# Patient Record
Sex: Male | Born: 1965 | Race: White | Hispanic: No | Smoking: Current every day smoker
Health system: Southern US, Community
[De-identification: ages and names within clinical notes are randomized; demographics above are authoritative.]

## PROBLEM LIST (undated history)

## (undated) DIAGNOSIS — G8929 Other chronic pain: Secondary | ICD-10-CM

## (undated) DIAGNOSIS — M549 Dorsalgia, unspecified: Secondary | ICD-10-CM

## (undated) DIAGNOSIS — M539 Dorsopathy, unspecified: Secondary | ICD-10-CM

## (undated) DIAGNOSIS — G5603 Carpal tunnel syndrome, bilateral upper limbs: Secondary | ICD-10-CM

## (undated) DIAGNOSIS — M542 Cervicalgia: Secondary | ICD-10-CM

## (undated) DIAGNOSIS — G894 Chronic pain syndrome: Secondary | ICD-10-CM

## (undated) DIAGNOSIS — M5412 Radiculopathy, cervical region: Secondary | ICD-10-CM

## (undated) DIAGNOSIS — M1990 Unspecified osteoarthritis, unspecified site: Secondary | ICD-10-CM

## (undated) DIAGNOSIS — J329 Chronic sinusitis, unspecified: Secondary | ICD-10-CM

## (undated) HISTORY — PX: NASAL SINUS SURGERY: SHX719

## (undated) HISTORY — PX: NECK SURGERY: SHX720

## (undated) HISTORY — PX: HAND SURGERY: SHX662

---

## 1997-12-19 ENCOUNTER — Emergency Department (HOSPITAL_COMMUNITY): Admission: EM | Admit: 1997-12-19 | Discharge: 1997-12-19 | Payer: Self-pay | Admitting: Emergency Medicine

## 1998-02-07 ENCOUNTER — Ambulatory Visit (HOSPITAL_COMMUNITY): Admission: RE | Admit: 1998-02-07 | Discharge: 1998-02-07 | Payer: Self-pay | Admitting: Internal Medicine

## 1998-06-07 ENCOUNTER — Encounter: Payer: Self-pay | Admitting: Neurosurgery

## 1998-06-07 ENCOUNTER — Inpatient Hospital Stay (HOSPITAL_COMMUNITY): Admission: RE | Admit: 1998-06-07 | Discharge: 1998-06-08 | Payer: Self-pay | Admitting: Neurosurgery

## 2000-06-10 ENCOUNTER — Ambulatory Visit (HOSPITAL_BASED_OUTPATIENT_CLINIC_OR_DEPARTMENT_OTHER): Admission: RE | Admit: 2000-06-10 | Discharge: 2000-06-10 | Payer: Self-pay | Admitting: Otolaryngology

## 2000-09-15 ENCOUNTER — Emergency Department (HOSPITAL_COMMUNITY): Admission: EM | Admit: 2000-09-15 | Discharge: 2000-09-15 | Payer: Self-pay | Admitting: Internal Medicine

## 2000-09-15 ENCOUNTER — Encounter: Payer: Self-pay | Admitting: Internal Medicine

## 2000-09-24 ENCOUNTER — Ambulatory Visit (HOSPITAL_COMMUNITY): Admission: RE | Admit: 2000-09-24 | Discharge: 2000-09-24 | Payer: Self-pay | Admitting: Orthopedic Surgery

## 2000-09-24 ENCOUNTER — Encounter: Payer: Self-pay | Admitting: Orthopedic Surgery

## 2003-01-20 ENCOUNTER — Encounter (HOSPITAL_COMMUNITY): Admission: RE | Admit: 2003-01-20 | Discharge: 2003-02-19 | Payer: Self-pay | Admitting: Family Medicine

## 2003-02-01 ENCOUNTER — Encounter: Payer: Self-pay | Admitting: Family Medicine

## 2003-03-28 ENCOUNTER — Emergency Department (HOSPITAL_COMMUNITY): Admission: EM | Admit: 2003-03-28 | Discharge: 2003-03-28 | Payer: Self-pay | Admitting: Emergency Medicine

## 2003-04-13 ENCOUNTER — Encounter: Admission: RE | Admit: 2003-04-13 | Discharge: 2003-04-13 | Payer: Self-pay | Admitting: Allergy and Immunology

## 2007-04-25 ENCOUNTER — Emergency Department (HOSPITAL_COMMUNITY): Admission: EM | Admit: 2007-04-25 | Discharge: 2007-04-25 | Payer: Self-pay | Admitting: Emergency Medicine

## 2007-04-26 ENCOUNTER — Emergency Department (HOSPITAL_COMMUNITY): Admission: EM | Admit: 2007-04-26 | Discharge: 2007-04-26 | Payer: Self-pay | Admitting: Emergency Medicine

## 2007-04-29 ENCOUNTER — Ambulatory Visit (HOSPITAL_COMMUNITY): Admission: RE | Admit: 2007-04-29 | Discharge: 2007-04-29 | Payer: Self-pay | Admitting: Urology

## 2009-06-25 ENCOUNTER — Emergency Department (HOSPITAL_BASED_OUTPATIENT_CLINIC_OR_DEPARTMENT_OTHER): Admission: EM | Admit: 2009-06-25 | Discharge: 2009-06-25 | Payer: Self-pay | Admitting: Emergency Medicine

## 2009-07-20 ENCOUNTER — Encounter (HOSPITAL_COMMUNITY): Admission: RE | Admit: 2009-07-20 | Discharge: 2009-08-19 | Payer: Self-pay | Admitting: Family Medicine

## 2010-02-20 ENCOUNTER — Ambulatory Visit (HOSPITAL_COMMUNITY): Admission: RE | Admit: 2010-02-20 | Discharge: 2010-02-20 | Payer: Self-pay | Admitting: Family Medicine

## 2010-02-21 ENCOUNTER — Encounter (HOSPITAL_COMMUNITY)
Admission: RE | Admit: 2010-02-21 | Discharge: 2010-03-23 | Payer: Self-pay | Source: Home / Self Care | Admitting: Family Medicine

## 2010-06-22 ENCOUNTER — Ambulatory Visit (HOSPITAL_COMMUNITY)
Admission: RE | Admit: 2010-06-22 | Discharge: 2010-06-22 | Disposition: A | Discharge status: Home / Self Care | Payer: Managed Care, Other (non HMO) | Source: Ambulatory Visit | Attending: Family Medicine | Admitting: Family Medicine

## 2010-06-22 DIAGNOSIS — M6281 Muscle weakness (generalized): Secondary | ICD-10-CM | POA: Insufficient documentation

## 2010-06-22 DIAGNOSIS — IMO0001 Reserved for inherently not codable concepts without codable children: Secondary | ICD-10-CM | POA: Insufficient documentation

## 2010-06-22 DIAGNOSIS — M542 Cervicalgia: Secondary | ICD-10-CM | POA: Insufficient documentation

## 2010-06-27 ENCOUNTER — Ambulatory Visit (HOSPITAL_COMMUNITY)
Admission: RE | Admit: 2010-06-27 | Discharge: 2010-06-27 | Disposition: A | Discharge status: Home / Self Care | Payer: Managed Care, Other (non HMO) | Source: Ambulatory Visit | Attending: Family Medicine | Admitting: Family Medicine

## 2010-06-27 DIAGNOSIS — M6281 Muscle weakness (generalized): Secondary | ICD-10-CM | POA: Insufficient documentation

## 2010-06-27 DIAGNOSIS — M542 Cervicalgia: Secondary | ICD-10-CM | POA: Insufficient documentation

## 2010-06-27 DIAGNOSIS — IMO0001 Reserved for inherently not codable concepts without codable children: Secondary | ICD-10-CM | POA: Insufficient documentation

## 2010-06-28 ENCOUNTER — Ambulatory Visit (HOSPITAL_COMMUNITY)
Admission: RE | Admit: 2010-06-28 | Discharge: 2010-06-28 | Disposition: A | Discharge status: Home / Self Care | Payer: Managed Care, Other (non HMO) | Source: Ambulatory Visit | Attending: Family Medicine | Admitting: Family Medicine

## 2010-06-28 DIAGNOSIS — M542 Cervicalgia: Secondary | ICD-10-CM | POA: Insufficient documentation

## 2010-06-28 DIAGNOSIS — IMO0001 Reserved for inherently not codable concepts without codable children: Secondary | ICD-10-CM | POA: Insufficient documentation

## 2010-06-28 DIAGNOSIS — M6281 Muscle weakness (generalized): Secondary | ICD-10-CM | POA: Insufficient documentation

## 2010-06-30 ENCOUNTER — Ambulatory Visit (HOSPITAL_COMMUNITY)
Admission: RE | Admit: 2010-06-30 | Discharge: 2010-06-30 | Disposition: A | Discharge status: Home / Self Care | Payer: Managed Care, Other (non HMO) | Source: Ambulatory Visit | Attending: Family Medicine | Admitting: Family Medicine

## 2010-06-30 DIAGNOSIS — M6281 Muscle weakness (generalized): Secondary | ICD-10-CM | POA: Insufficient documentation

## 2010-06-30 DIAGNOSIS — IMO0001 Reserved for inherently not codable concepts without codable children: Secondary | ICD-10-CM | POA: Insufficient documentation

## 2010-06-30 DIAGNOSIS — M542 Cervicalgia: Secondary | ICD-10-CM | POA: Insufficient documentation

## 2010-07-05 ENCOUNTER — Ambulatory Visit (HOSPITAL_COMMUNITY): Payer: PRIVATE HEALTH INSURANCE | Admitting: Physical Therapy

## 2010-07-06 ENCOUNTER — Ambulatory Visit (HOSPITAL_COMMUNITY)
Admission: RE | Admit: 2010-07-06 | Discharge: 2010-07-06 | Disposition: A | Discharge status: Home / Self Care | Payer: Managed Care, Other (non HMO) | Source: Ambulatory Visit | Attending: Family Medicine | Admitting: Family Medicine

## 2010-07-07 ENCOUNTER — Ambulatory Visit (HOSPITAL_COMMUNITY)
Admission: RE | Admit: 2010-07-07 | Discharge: 2010-07-07 | Disposition: A | Discharge status: Home / Self Care | Payer: Managed Care, Other (non HMO) | Source: Ambulatory Visit | Attending: *Deleted | Admitting: *Deleted

## 2010-07-10 ENCOUNTER — Ambulatory Visit (HOSPITAL_COMMUNITY): Payer: PRIVATE HEALTH INSURANCE

## 2010-07-12 ENCOUNTER — Ambulatory Visit (HOSPITAL_COMMUNITY)
Admission: RE | Admit: 2010-07-12 | Discharge: 2010-07-12 | Disposition: A | Discharge status: Home / Self Care | Payer: Managed Care, Other (non HMO) | Source: Ambulatory Visit | Attending: *Deleted | Admitting: *Deleted

## 2010-07-14 ENCOUNTER — Ambulatory Visit (HOSPITAL_COMMUNITY)
Admission: RE | Admit: 2010-07-14 | Discharge: 2010-07-14 | Disposition: A | Discharge status: Home / Self Care | Payer: Managed Care, Other (non HMO) | Source: Ambulatory Visit | Attending: *Deleted | Admitting: *Deleted

## 2010-07-17 ENCOUNTER — Ambulatory Visit (HOSPITAL_COMMUNITY): Payer: PRIVATE HEALTH INSURANCE

## 2010-07-19 ENCOUNTER — Ambulatory Visit (HOSPITAL_COMMUNITY): Payer: PRIVATE HEALTH INSURANCE

## 2010-07-19 ENCOUNTER — Other Ambulatory Visit (HOSPITAL_COMMUNITY): Payer: Self-pay | Admitting: Family Medicine

## 2010-07-19 DIAGNOSIS — M549 Dorsalgia, unspecified: Secondary | ICD-10-CM

## 2010-07-21 ENCOUNTER — Ambulatory Visit (HOSPITAL_COMMUNITY): Admission: RE | Admit: 2010-07-21 | Payer: Managed Care, Other (non HMO) | Source: Ambulatory Visit

## 2010-07-31 ENCOUNTER — Ambulatory Visit (HOSPITAL_COMMUNITY)
Admission: RE | Admit: 2010-07-31 | Discharge: 2010-07-31 | Disposition: A | Discharge status: Home / Self Care | Payer: Managed Care, Other (non HMO) | Source: Ambulatory Visit | Attending: Family Medicine | Admitting: Family Medicine

## 2010-07-31 ENCOUNTER — Other Ambulatory Visit (HOSPITAL_COMMUNITY): Payer: Self-pay | Admitting: Family Medicine

## 2010-07-31 DIAGNOSIS — M549 Dorsalgia, unspecified: Secondary | ICD-10-CM

## 2010-07-31 DIAGNOSIS — M546 Pain in thoracic spine: Secondary | ICD-10-CM | POA: Insufficient documentation

## 2010-07-31 DIAGNOSIS — IMO0002 Reserved for concepts with insufficient information to code with codable children: Secondary | ICD-10-CM | POA: Insufficient documentation

## 2010-08-17 ENCOUNTER — Encounter (HOSPITAL_COMMUNITY): Payer: Managed Care, Other (non HMO) | Admitting: Physical Therapy

## 2010-10-06 NOTE — Op Note (Signed)
. [facility]  Patient:    Nathan Farmer, Nathan Farmer                        MRN: [id number] Proc. Date: 06/11/00 Adm. Date:  [id number] Attending:  Serena Colonel H                           Operative Report  PREOPERATIVE DIAGNOSIS:  Chronic ethmoid and maxillary sinusitis.  POSTOPERATIVE DIAGNOSIS:  Chronic ethmoid and maxillary sinusitis.  OPERATION PERFORMED: 1. Revision endoscopic total ethmoidectomy, bilateral. 2. Bilateral endoscopic maxillary antrostomy. 3. Use of Insta-Track image guidance system.  SURGEON:  Jefry H. Pollyann Kennedy, M.D.  ANESTHESIA:  General endotracheal.  COMPLICATIONS:  None.  ESTIMATED BLOOD LOSS:  50 cc.  The patient tolerated the procedure well, was awakened, extubated and transfered to recovery in stable condition.  INDICATIONS FOR PROCEDURE:  The patient is a 45 year old who underwent nasal surgery about a year ago and ever since that time has had chronic sinus infections, including congestion and nasal discharge.  He has had repeated CT scans of the sinuses which has consistently revealed ethmoid and maxillary sinus opacification.  The risks, benefits, alternatives and complications of the procedure were explained to the patient, who seemed to understand and agreed to surgery.  DESCRIPTION OF PROCEDURE:  The patient was taken to the operating room and placed on the operating table in supine position.  Following induction of general endotracheal anesthesia, the patient was prepped and draped in standard fashion.  The Insta-Track head gear was positioned properly and was calibrated.  1 - Bilateral endoscopic total ethmoidectomy.  0 degree endoscopes were used to visualize the nasal and sinus structures.  Topical Afrin was used for vasoconstriction preoperatively and 1% Xylocaine with epinephrine was infiltrated into the lateral nasal wall area and the ethmoid sinus area. Inspection with the 0 degree scope revealed complete  surgical absence of the middle turbinates bilaterally.  Multiple ethmoid cells were present, anterior and posterior and were obstructed and filled with polypoid material.  A complete ethmoidectomy was performed using Insta-Track guidance to be sure of positioning since the middle turbinates had been removed.  The lamina papyracea was intact all along the lateral wall.  The fovea ethmoidalis was intact superiorly.  These served as the limits of dissection.  The face of the sphenoid was identified bilaterally.  The left sphenoid was opened and some polypoid tissue was removed from the ostium.  The sinus itself was clear. Dissection up anteriorly revealed a large amount of polypoid tissue in the frontal recess area that was thoroughly cleaned out.  The frontal sinuses appeared to be clear.  2 - Bilateral endoscopic maxillary antrostomy.   The natural ostium of the maxillary sinuses bilaterally could not be identified.  The landmarks of the uncinate process and the middle turbinate were completely gone.  The lateral nasal wall was smooth and somewhat bulging towards the midline.  Using Insta-Track guidance, the maxillary sinuses were entered using curved suction and then the antrostomies were enlarged using backbiting forceps and straight through-cut forceps.  Large antrostomies were created bilaterally.  There was thick mucopurulent material in the right maxillary sinus.  The other noted finding was two superior septal perforations that were present.  The edges were clean and well healed.  The nasal cavities were suctioned of blood and debris and packed with ethmoid sinus packs in the bilateral ethmoid sinuses. The pharynx was  suctioned of blood and secretions.  The patient was then awakened, extubated and transferred to recovery. DD:  06/11/00 TD:  06/11/00 Job: [id number]

## 2011-02-26 LAB — STREP A DNA PROBE

## 2011-02-26 LAB — RAPID STREP SCREEN (MED CTR MEBANE ONLY): Streptococcus, Group A Screen (Direct): NEGATIVE

## 2011-11-15 ENCOUNTER — Encounter (HOSPITAL_BASED_OUTPATIENT_CLINIC_OR_DEPARTMENT_OTHER): Payer: Self-pay | Admitting: *Deleted

## 2011-11-15 ENCOUNTER — Emergency Department (HOSPITAL_BASED_OUTPATIENT_CLINIC_OR_DEPARTMENT_OTHER): Payer: Worker's Compensation

## 2011-11-15 ENCOUNTER — Emergency Department (HOSPITAL_BASED_OUTPATIENT_CLINIC_OR_DEPARTMENT_OTHER)
Admission: EM | Admit: 2011-11-15 | Discharge: 2011-11-15 | Disposition: A | Discharge status: Home / Self Care | Payer: Worker's Compensation | Attending: Emergency Medicine | Admitting: Emergency Medicine

## 2011-11-15 DIAGNOSIS — M542 Cervicalgia: Secondary | ICD-10-CM | POA: Insufficient documentation

## 2011-11-15 DIAGNOSIS — F17200 Nicotine dependence, unspecified, uncomplicated: Secondary | ICD-10-CM | POA: Insufficient documentation

## 2011-11-15 DIAGNOSIS — R209 Unspecified disturbances of skin sensation: Secondary | ICD-10-CM | POA: Insufficient documentation

## 2011-11-15 DIAGNOSIS — Z8739 Personal history of other diseases of the musculoskeletal system and connective tissue: Secondary | ICD-10-CM | POA: Insufficient documentation

## 2011-11-15 HISTORY — DX: Unspecified osteoarthritis, unspecified site: M19.90

## 2011-11-15 MED ORDER — CYCLOBENZAPRINE HCL 10 MG PO TABS: 5.0000 mg | ORAL_TABLET | Freq: Three times a day (TID) | ORAL | Status: AC | PRN

## 2011-11-15 NOTE — ED Notes (Signed)
pts supervisor david meeks called from [employer] to find out if pt is released to full duty spoke with dr hunt who treated him this morning she states he is to be off today but can return to full duty tomorrow Mr Nathan Farmer notified of the above
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
## 2011-11-15 NOTE — ED Notes (Signed)
Patient transported to CT 

## 2011-11-15 NOTE — ED Notes (Signed)
Pt states that his supervisor told him not to have a UDS done today during this visit.
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
## 2012-06-26 ENCOUNTER — Ambulatory Visit (HOSPITAL_COMMUNITY)
Admission: RE | Admit: 2012-06-26 | Discharge: 2012-06-26 | Disposition: A | Discharge status: Home / Self Care | Payer: Managed Care, Other (non HMO) | Source: Ambulatory Visit | Attending: Family Medicine | Admitting: Family Medicine

## 2012-06-26 DIAGNOSIS — IMO0001 Reserved for inherently not codable concepts without codable children: Secondary | ICD-10-CM | POA: Insufficient documentation

## 2012-06-26 DIAGNOSIS — M6281 Muscle weakness (generalized): Secondary | ICD-10-CM | POA: Insufficient documentation

## 2012-06-26 DIAGNOSIS — M62830 Muscle spasm of back: Secondary | ICD-10-CM | POA: Insufficient documentation

## 2012-06-26 DIAGNOSIS — M542 Cervicalgia: Secondary | ICD-10-CM | POA: Insufficient documentation

## 2012-06-26 NOTE — Evaluation (Signed)
Physical Therapy Evaluation  Patient Details  Name: Nathan Farmer MRN: [id number] Date of Birth: Jan 23, 1966 Charge: Eval; massage x 10 Today's Date: 06/26/2012 Time: 4098-1191 PT Time Calculation (min): 59 min  Visit#: 1  of 9   Re-eval: 07/17/12 Assessment Diagnosis: cervical pain Surgical Date:  (1999)  Authorization: generic commercial   Past Medical History:  Past Medical History  Diagnosis Date  . Arthritis    Past Surgical History:  Past Surgical History  Procedure Date  . Nasal sinus surgery   . Neck surgery   . Hand surgery     Subjective Symptoms/Limitations Symptoms: Nathan Farmer states that he has had increased neck and upper back pain; he states the pain is progressive.  He states his pain is more to the right and goes into his shoulder blade.  He states his pain is constant but varies in intensity. Pertinent History: cervical surgery 15 yrs ago How long can you sit comfortably?: He will have increased pain after 15 minutes if he is working on the computer.  If the patient is sitting in a chair not looking down he will have increased pain. How long can you stand comfortably?: The patient is able to stand; he shifts his weright back and forth How long can you walk comfortably?: walking does not bother him Pain Assessment Currently in Pain?: Yes Pain Score:  (worst is an 8-9/10) Pain Location: Neck Pain Orientation: Right Pain Radiating Towards: shoulder Pain Onset: More than a month ago Pain Frequency: Constant Pain Relieving Factors: pain meds    Prior Functioning  Prior Function Vocation: Full time employment Vocation Requirements: switcher-move trailers; looking backward all night Leisure: Hobbies-yes (Comment) Comments: golf but has not since October. ; rides [place]   Assessment Cervical AROM Cervical Flexion: wnl Cervical Extension: wnl Cervical - Right Side Bend: decreased 10% with reps increasing pain Cervical - Left Side Bend: decreased 20%  with reps causing increased pain Cervical - Right Rotation: decreased 10% Cervical - Left Rotation: decreasd 15% Cervical Strength Cervical Extension: 5/5 Cervical - Right Side Bend: 4/5 Cervical - Left Side Bend:  (4-/5)  Exercise/Treatments    Stretches Neck Stretch: 2 reps;30 seconds   Seated Exercises Cervical Isometrics: Extension;Right lateral flexion;Left lateral flexion;10 reps W Back: 5 reps Shoulder Shrugs:  (up/back and relax)  Manual Therapy Manual Therapy: Massage Massage: B cervical/upper thoracic w/ spasm noted at paraspinal mm T1-3  Physical Therapy Assessment and Plan PT Assessment and Plan Clinical Impression Statement: Pt with decreased stability and increased cervical pain who will benefit from skilled PT to decrease pt pain and improve his functinal activiy  without pain. Rehab Potential: Good PT Frequency: Min 3X/week PT Duration:  (3 weeks) PT Treatment/Interventions: Therapeutic exercise;Therapeutic activities;Manual techniques;Modalities PT Plan: begin postural t-band ex, shld adduction, UBE backward, c-retraction.  3rd treatment begin wall pushup, prone shld extension, prone rows; 4th treatment begin prone W-back, double arm raise; progress to plank    Goals Home Exercise Program Pt will Perform Home Exercise Program: Independently PT Short Term Goals Time to Complete Short Term Goals: 2 weeks PT Short Term Goal 1: ROM wnl to allow pt to look backwhen hooking up trailer at work without pain PT Short Term Goal 2: Pain no greater than a 4/10  PT Long Term Goals Time to Complete Long Term Goals: 4 weeks PT Long Term Goal 1: I in  advance HEP PT Long Term Goal 2: Pain no greater than a 2/10 80% of the day Long Term Goal  3: strength to be wnl to allow the above to occur  Problem List Patient Active Problem List  Diagnosis  . Spasm of paraspinal muscle  . Stiffness of joint, not elsewhere classified, other specified site    PT - End of  Session Activity Tolerance: Patient tolerated treatment well General Behavior During Session: [facility] for tasks performed Cognition: [facility] for tasks performed PT Plan of Care PT Home Exercise Plan: given  GP    Theresa Dohrman,CINDY 06/26/2012, 2:55 PM  Physician Documentation Your signature is required to indicate approval of the treatment plan as stated above.  Please sign and either send electronically or make a copy of this report for your files and return this physician signed original.   Please mark one 1.__approve of plan  2. ___approve of plan with the following conditions.   ______________________________                                                          _____________________ Physician Signature                                                                                                             Date

## 2012-06-30 ENCOUNTER — Ambulatory Visit (HOSPITAL_COMMUNITY)
Admission: RE | Admit: 2012-06-30 | Discharge: 2012-06-30 | Disposition: A | Discharge status: Home / Self Care | Payer: Managed Care, Other (non HMO) | Source: Ambulatory Visit | Attending: Family Medicine | Admitting: Family Medicine

## 2012-06-30 DIAGNOSIS — M62830 Muscle spasm of back: Secondary | ICD-10-CM

## 2012-06-30 NOTE — Progress Notes (Signed)
Physical Therapy Treatment Patient Details  Name: CORDARO MUKAI MRN: [id number] Date of Birth: Apr 04, 1966  Today's Date: 06/30/2012 Time: 0924-1015 PT Time Calculation (min): 51 min  Visit#: 2 of 9  Re-eval: 07/17/12 Assessment Diagnosis: cervical pain Charge: therex x 36', massage x 15'  Authorization: [employer] (consulting) Time Period:    Authorization Visit#: 2 of 9   Subjective: Symptoms/Limitations Symptoms: Pt stated neck pain 5-6/10 believes related to bad weather. Pain Assessment Pain Score:   6 Pain Location: Neck Pain Orientation: Right  Objective:   Exercise/Treatments Stretches Upper Trapezius Stretch: 2 reps;30 seconds Neck Stretch: 2 reps;30 seconds Machines for Strengthening UBE (Upper Arm Bike): 6' @ 1.0 backwards for postural strengthening Theraband Exercises Scapula Retraction: 15 reps;Blue Shoulder Extension: 15 reps;Blue Rows: 15 reps;Blue Shoulder ADduction: 15 reps;Blue Seated Exercises Neck Retraction: 10 reps;5 secs X to V: 10 reps W Back: 10 reps Shoulder Shrugs: 10 reps (up/back and relax)     Physical Therapy Assessment and Plan PT Assessment and Plan Clinical Impression Statement: Began PT treatment for postural strengthening per PT POC.  Pt able to demonstrate appropriate technique and form with theraband exercises following cues for correct musculature activation and core activation to improve posture.  Noted spasms R paraspinals T3-5, able to reduce but not fully resolve.  Pt encouraged to increase HEP frequency for maximum benefits. PT Plan: Next treatment begin wall pushups, prone shoulder extension and rows; 4th session begin prone w-back, double arm raises and progress to planks.    Goals    Problem List Patient Active Problem List  Diagnosis  . Spasm of paraspinal muscle  . Stiffness of joint, not elsewhere classified, other specified site    PT - End of Session Activity Tolerance: Patient  tolerated treatment well General Behavior During Session: [facility] for tasks performed Cognition: [facility] for tasks performed  GP    Juel Burrow 06/30/2012, 12:22 PM

## 2012-07-02 ENCOUNTER — Ambulatory Visit (HOSPITAL_COMMUNITY)
Admission: RE | Admit: 2012-07-02 | Discharge: 2012-07-02 | Disposition: A | Discharge status: Home / Self Care | Payer: Managed Care, Other (non HMO) | Source: Ambulatory Visit | Attending: Family Medicine | Admitting: Family Medicine

## 2012-07-02 DIAGNOSIS — M62830 Muscle spasm of back: Secondary | ICD-10-CM

## 2012-07-02 NOTE — Progress Notes (Signed)
Physical Therapy Treatment Patient Details  Name: Nathan Farmer MRN: [id number] Date of Birth: August 17, 1965  Today's Date: 07/02/2012 Time: 0930-1026 PT Time Calculation (min): 56 min  Visit#: 3 of    Re-eval: 07/17/12  Charge: therex 30' massage 23'  Authorization: [employer] (consulting) Time Period:    Authorization Visit#: 3 of 9   Subjective: Symptoms/Limitations Symptoms: My neck and spot on back are about average today, pain scale 5-6/10 due to the bad weather. Pain Assessment Currently in Pain?: Yes Pain Score:   6 Pain Location: Neck  Objective:   Exercise/Treatments Stretches Upper Trapezius Stretch: 2 reps;30 seconds Neck Stretch: 2 reps;30 seconds Machines for Strengthening UBE (Upper Arm Bike): 6' @ 1.0 backwards for postural strengthening Theraband Exercises Scapula Retraction: 15 reps;Blue Shoulder Extension: 15 reps;Blue Rows: 15 reps;Blue Shoulder ADduction: 15 reps;Blue Standing Exercises Wall Push Ups: 10 reps Seated Exercises Neck Retraction: 10 reps;5 secs X to V: 10 reps W Back: 10 reps Shoulder Shrugs: 10 reps (up/back and relax) Prone Exercises Shoulder Extension: 10 reps Rows: 10 reps  Manual Therapy Manual Therapy: Massage Massage: B cervical/thoracic w/ multiple spasms R side paraspinals T1-5 x 23'  Physical Therapy Assessment and Plan PT Assessment and Plan Clinical Impression Statement: Therex instructed by Nicholes Rough, PTT.  Added prone exercises and wall push up for postural strengthening, pt able to demonstrate appropraite technique following cues.  Several spasms noted R paraspinals T1-5 and scapular region, able to reduce but unable to fully resolve.  Pt stated pain reduced following manual.  Pt encouraged to increase frequency with HEP and stretches PT Plan: Continue current POC,  Next session begin prone wback, double arm raises and progress to planks.  Continue manual techniques to reduce spasms.     Goals    Problem List Patient Active Problem List  Diagnosis  . Spasm of paraspinal muscle  . Stiffness of joint, not elsewhere classified, other specified site    PT - End of Session Activity Tolerance: Patient tolerated treatment well General Behavior During Session: [facility] for tasks performed Cognition: [facility] for tasks performed  GP    Juel Burrow 07/02/2012, 1:21 PM

## 2012-07-04 ENCOUNTER — Ambulatory Visit (HOSPITAL_COMMUNITY): Payer: Self-pay

## 2012-07-07 ENCOUNTER — Ambulatory Visit (HOSPITAL_COMMUNITY)
Admission: RE | Admit: 2012-07-07 | Discharge: 2012-07-07 | Disposition: A | Discharge status: Home / Self Care | Payer: Managed Care, Other (non HMO) | Source: Ambulatory Visit | Attending: Family Medicine | Admitting: Family Medicine

## 2012-07-07 NOTE — Progress Notes (Signed)
Physical Therapy Treatment Patient Details  Name: Nathan Farmer MRN: [id number] Date of Birth: April 07, 1966  Today's Date: 07/07/2012 Time: 4098-1191 PT Time Calculation (min): 45 min  Visit#: 3 of 9  Re-eval: 07/17/12 Charges: Therex x 25' Manual x 15'  Authorization: [employer] (consulting) Visit#: 3 of 9   Subjective: Symptoms/Limitations Symptoms: Pt reprots "crunching" in his neck. Pain Assessment Currently in Pain?: Yes Pain Score:   7 Pain Location: Neck Pain Orientation: Right  Precautions/Restrictions     Exercise/Treatments Machines for Strengthening UBE (Upper Arm Bike): 6' @ 1.0 backwards for postural strengthening Theraband Exercises Scapula Retraction: 15 reps;Blue Shoulder Extension: 15 reps;Blue Rows: 15 reps;Blue Shoulder ADduction: 15 reps;Blue Seated Exercises Neck Retraction: 15 reps X to V: 15 reps W Back: 15 reps  Manual Therapy Manual Therapy: Massage Massage: B cervical/thoracic w/ multiple spasms R side paraspinals T1-5   Physical Therapy Assessment and Plan PT Assessment and Plan Clinical Impression Statement: Pt completes therex well with cuing for head alignment. Continued manual techniques to decrease pain and spasms. Spasms have decrease.  Pt reports pain decrease to 4/10 at end of session. Pt is interested in laser therapy. Will consult PT. PT Plan: Continue current POC,  Next session begin prone wback, double arm raises and progress to planks.  Continue manual techniques to reduce spasms.     Problem List Patient Active Problem List  Diagnosis  . Spasm of paraspinal muscle  . Stiffness of joint, not elsewhere classified, other specified site    PT - End of Session Activity Tolerance: Patient tolerated treatment well General Behavior During Session: [facility] for tasks performed Cognition: [facility] for tasks performed  Seth Bake, PTA  07/07/2012, 10:34 AM

## 2012-07-09 ENCOUNTER — Ambulatory Visit (HOSPITAL_COMMUNITY)
Admission: RE | Admit: 2012-07-09 | Discharge: 2012-07-09 | Disposition: A | Discharge status: Home / Self Care | Payer: Managed Care, Other (non HMO) | Source: Ambulatory Visit | Attending: Family Medicine | Admitting: Family Medicine

## 2012-07-09 NOTE — Progress Notes (Signed)
Physical Therapy Treatment Patient Details  Name: Nathan Farmer MRN: [id number] Date of Birth: [birth year]  Today's Date: 07/09/2012 Time: 1019-1108 PT Time Calculation (min): 49 min Visit#: 4 of 9  Re-eval: 07/17/12 Authorization: Generic Workers Engineer, civil (consulting) Visit#: 4 of 9  Charges;  Therex 28', massage 15'  Subjective: Symptoms/Limitations Symptoms: Pt states last treatment really helped.  Not having much pain today only 2-3/10. Pain Assessment Currently in Pain?: Yes Pain Score:   3 Pain Location: Neck Pain Orientation: Right   Exercise/Treatments Machines for Strengthening UBE (Upper Arm Bike): 6' @ 1.0 backwards for postural strengthening Theraband Exercises Scapula Retraction: 15 reps;Blue Shoulder Extension: 15 reps;Blue Rows: 15 reps;Blue Shoulder ADduction: 15 reps;Blue Other Theraband Exercises: tall kneel theraband B UE flexion Standing Exercises Wall Push Ups: 15 reps Seated Exercises Neck Retraction: 15 reps X to V: 15 reps W Back: 15 reps Shoulder Shrugs: 15 reps Prone Exercises W Back: Limitations W Back Limitations: add next visit Plank: add next visit   Manual Therapy Manual Therapy: Massage Massage: B cervical/thoracic with multiple spasms in seated position  Physical Therapy Assessment and Plan PT Assessment and Plan Clinical Impression Statement: Added kneeling B UE flexion exercise with tband.  Pt .required VC's to stabilize core with activites and keep head in neutral.  Overall reduction in spasms and less pain today before and after treatment..  Large spasm in R rhomboid unable to resolve. PT Plan: Continue current POC,  Next session begin prone wback, double arm raises and progress to planks.  Continue manual techniques to reduce spasms.     Problem List Patient Active Problem List  Diagnosis  . Spasm of paraspinal muscle  . Stiffness of joint, not elsewhere classified, other specified site    PT - End of  Session Activity Tolerance: Patient tolerated treatment well General Behavior During Session: [facility] for tasks performed Cognition: [facility] for tasks performed    Lurena Nida, PTA/CLT 07/09/2012, 11:19 AM

## 2012-07-11 ENCOUNTER — Ambulatory Visit (HOSPITAL_COMMUNITY)
Admission: RE | Admit: 2012-07-11 | Discharge: 2012-07-11 | Disposition: A | Discharge status: Home / Self Care | Payer: Managed Care, Other (non HMO) | Source: Ambulatory Visit | Attending: Family Medicine | Admitting: Family Medicine

## 2012-07-11 NOTE — Progress Notes (Signed)
Physical Therapy Treatment Patient Details  Name: Nathan Farmer MRN: [id number] Date of Birth: 12/27/1965  Today's Date: 07/11/2012 Time: 1016-1055 PT Time Calculation (min): 39 min Charge:  There ex 32'; massage x 14. Visit#: 5 of 9  Re-eval: 07/18/12    Authorization: Generic Workers Engineer, civil (consulting) Time Period:    Authorization Visit#: 5 of 9   Subjective: Symptoms/Limitations Symptoms: Pt states last new exercise really incrreased his back pain.   Pertinent History: cervical surgery 15 yrs ago Pain Assessment Currently in Pain?: Yes Pain Score:   5 Pain Location: Back Pain Orientation: Upper   Exercise/Treatments UBE (Upper Arm Bike): 4' @ 2.0 backwards for postural strengthening Theraband Exercises Scapula Retraction: 15 reps;Blue Shoulder Extension: 15 reps;Blue Rows: 15 reps;Blue Other Theraband Exercises: tall kneel theraband B UE flexion   Prone Exercises W Back: 10 reps;Weight Shoulder Extension: 10 reps;Weights Shoulder Extension Weights (lbs): 2 Rows: 10 reps;Weights Rows Weights (lbs): 2 Plank: 10" x 5    Manual Therapy Manual Therapy: Massage Massage: no spasms noted today.  Pt massage completed prone.  Physical Therapy Assessment and Plan PT Assessment and Plan Clinical Impression Statement: Pt given T-band for HEP; Pt added new exercises with good form after verbal cuing.  Planks were dificult for pt to complete. Rehab Potential: Good PT Frequency: Min 3X/week PT Treatment/Interventions: Therapeutic exercise;Therapeutic activities;Manual techniques;Modalities PT Plan: Continue with treatment begin gentle manual traction w/ suboccipital release next treatment.  Do not do mechanical tx as pt has had cervical surgery.    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 Spasm of paraspinal muscle  . Stiffness of joint, not elsewhere classified, other specified site    PT - End of Session Activity Tolerance: Patient tolerated treatment well General Behavior During Session: [facility] for tasks performed Cognition: [facility] for tasks performed  GP    RUSSELL,CINDY 07/11/2012, 12:07 PM

## 2012-07-14 ENCOUNTER — Ambulatory Visit (HOSPITAL_COMMUNITY)
Admission: RE | Admit: 2012-07-14 | Discharge: 2012-07-14 | Disposition: A | Discharge status: Home / Self Care | Payer: Managed Care, Other (non HMO) | Source: Ambulatory Visit | Attending: Family Medicine | Admitting: Family Medicine

## 2012-07-14 NOTE — Progress Notes (Signed)
Physical Therapy Treatment Patient Details  Name: DONDRELL LOUDERMILK MRN: [id number] Date of Birth: [birth year]  Today's Date: 07/14/2012 Time: 1020-1100 PT Time Calculation (min): 40 min  Visit#: 6 of 9  Re-eval: 07/18/12 Charges: Therex x 26' Manual x 12'  Authorization: Generic Workers Pharmacist, community Visit#: 6 of 9   Subjective: Symptoms/Limitations Symptoms: Pt states that pain has moved to L shoulder blade. Pain Assessment Currently in Pain?: Yes Pain Score:   4   Exercise/Treatments Prone Exercises W Back: 10 reps;Weight W Back Weights (lbs): 2 Shoulder Extension: 15 reps Shoulder Extension Weights (lbs): 2 Rows: 15 reps Rows Weights (lbs): 2 Plank: 10" x 5  Manual Therapy Manual Therapy: Massage Massage: MFR to L rhomboids to decrease spasms  Physical Therapy Assessment and Plan PT Assessment and Plan Clinical Impression Statement: Pt completes therex well with minimal need for cuing. Multiple spasms noted in L rhomboids. Spasms resolved 90% after manual techniques. P reports no change in pain at end of session. PT Plan: Continue to progress scapular strength and decrease spasms per PT POC.    Goals    Problem List Patient Active Problem List  Diagnosis  . Spasm of paraspinal muscle  . Stiffness of joint, not elsewhere classified, other specified site    PT - End of Session Activity Tolerance: Patient tolerated treatment well General Behavior During Session: [facility] for tasks performed Cognition: [facility] for tasks performed  Seth Bake, PTA  07/14/2012, 12:28 PM

## 2012-07-16 ENCOUNTER — Ambulatory Visit (HOSPITAL_COMMUNITY): Payer: Self-pay | Admitting: Physical Therapy

## 2012-07-18 ENCOUNTER — Ambulatory Visit (HOSPITAL_COMMUNITY)
Admission: RE | Admit: 2012-07-18 | Discharge: 2012-07-18 | Disposition: A | Discharge status: Home / Self Care | Payer: Managed Care, Other (non HMO) | Source: Ambulatory Visit | Attending: Family Medicine | Admitting: Family Medicine

## 2012-07-18 DIAGNOSIS — M62830 Muscle spasm of back: Secondary | ICD-10-CM

## 2012-07-18 NOTE — Evaluation (Signed)
Physical Therapy Evaluation  Patient Details  Name: Nathan Farmer MRN: [id number] Date of Birth: Jan 27, 1966  Today's Date: 07/18/2012 Time:1025  - 1056  charge:  ROM test,; there ex x 12; massage x 14  Visit#: 7 of 9  Re-eval: 07/18/12 Assessment Diagnosis: cervical pain Surgical Date:  (1999)  Past Medical History:  Past Medical History  Diagnosis Date  . Arthritis    Past Surgical History:  Past Surgical History  Procedure Laterality Date  . Nasal sinus surgery    . Neck surgery    . Hand surgery      Subjective Symptoms/Limitations Symptoms: Pt states that he does not feel much better. Pertinent History: cervical surgery 15 yrs ago How long can you sit comfortably?: Able to sit for 45 min befoe he begins hurting was 15.  How long can you stand comfortably?: The patient is able to stand; he shifts his weright back and forth How long can you walk comfortably?: walking does not bother him Pain Assessment Currently in Pain?: Yes Pain Score:   7 Pain Location: Neck Pain Type: Chronic pain Pain Onset: More than a month ago Pain Frequency: Constant    Prior Functioning  Prior Function Vocation: Full time employment Vocation Requirements: switcher-move trailers; looking backward all night Leisure: Hobbies-yes (Comment)   Assessment Cervical AROM Cervical Flexion: wnl Cervical Extension: wnl Cervical - Right Side Bend: wnl was decreased 10/5 (wnl) Cervical - Left Side Bend: wnl was decreased 20% Cervical - Right Rotation: decreased 10% Cervical - Left Rotation: decreasd 10% Cervical Strength Cervical Extension: 5/5 Cervical - Right Side Bend: 5/5 (was 4/5) Cervical - Left Side Bend: 5/5 (was 4-/5)  Exercise/Treatments   Standing Exercises Thumb Tacks:  (2') Other Standing Exercises: scapular elevation/ retraction x 10   Prone Exercises W Back: 10 reps;Weight W Back Weights (lbs): 3 Shoulder Extension: 15 reps Shoulder Extension Weights (lbs):  3 Rows: 15 reps Rows Weights (lbs): 3 Plank: 10" x 5  Manual Therapy Manual Therapy: Massage Massage: no spasm noted  Physical Therapy Assessment and Plan PT Assessment and Plan Clinical Impression Statement: Pt with improved objective but no change subjectively.  Pt ROM and strength is wfl but continues to complain of significant pain along R lower neck and R scapular area. PT Plan: discontinue therapy.  PT to return to MD as therapy does not appear to be decreasing subjective complaint of painl    Goals Home Exercise Program Pt will Perform Home Exercise Program: Independently PT Short Term Goals Time to Complete Short Term Goals: 2 weeks PT Short Term Goal 1: ROM wnl to allow pt to look backwhen hooking up trailer at work without pain PT Short Term Goal 1 - Progress: Partly met (ROM wnl but has pain doing motion) PT Short Term Goal 2: Pain no greater than a 4/10  PT Short Term Goal 2 - Progress: Not met PT Long Term Goals PT Long Term Goal 1: I in  advance HEP PT Long Term Goal 1 - Progress: Met PT Long Term Goal 2: Pain no greater than a 2/10 80% of the day PT Long Term Goal 2 - Progress: Not met Long Term Goal 3: strength to be wnl to allow the above to occur Long Term Goal 3 Progress: Met  Problem List Patient Active Problem List  Diagnosis  . Spasm of paraspinal muscle  . Stiffness of joint, not elsewhere classified, other specified site    PT - End of Session Activity Tolerance: Patient tolerated treatment  well General Behavior During Session: [facility] for tasks performed Cognition: [facility] for tasks performed PT Plan of Care PT Home Exercise Plan: given  GP    RUSSELL,CINDY 07/18/2012, 11:16 AM  Physician Documentation Your signature is required to indicate approval of the treatment plan as stated above.  Please sign and either send electronically or make a copy of this report for your files and return this physician signed original.   Please mark one 1.__approve  of plan  2. ___approve of plan with the following conditions.   ______________________________                                                          _____________________ Physician Signature                                                                                                             Date

## 2012-08-18 ENCOUNTER — Telehealth: Payer: Self-pay | Admitting: Family Medicine

## 2012-08-18 NOTE — Telephone Encounter (Signed)
Patients rash in his mouth is no better, and patient is requesting to have the "magic mouthwash" called in to [facility].

## 2012-08-18 NOTE — Telephone Encounter (Signed)
16 oz duke's mmw. One tablespoon swish and spit qid

## 2012-08-18 NOTE — Telephone Encounter (Signed)
Pt notified rx called in.

## 2012-08-18 NOTE — Telephone Encounter (Signed)
rx called in to pharm. [facility] to notify pt.

## 2012-09-08 ENCOUNTER — Telehealth: Payer: Self-pay | Admitting: Family Medicine

## 2012-09-08 NOTE — Telephone Encounter (Signed)
Pt needs a hand written 90 day supply due to his insurance policy for his Pantoprazole 40 mg, FYI.Marland Kitchen this patient has been FLAGGED for DISCHARGE.

## 2012-09-09 NOTE — Telephone Encounter (Signed)
rx ready for pick up pt notified.  

## 2012-09-10 ENCOUNTER — Ambulatory Visit (INDEPENDENT_AMBULATORY_CARE_PROVIDER_SITE_OTHER): Payer: Managed Care, Other (non HMO) | Admitting: Family Medicine

## 2012-09-10 ENCOUNTER — Encounter: Payer: Self-pay | Admitting: Family Medicine

## 2012-09-10 VITALS — BP 132/80 | Temp 98.4°F | Wt 227.4 lb

## 2012-09-10 DIAGNOSIS — J309 Allergic rhinitis, unspecified: Secondary | ICD-10-CM

## 2012-09-10 MED ORDER — METHYLPREDNISOLONE ACETATE 80 MG/ML IJ SUSP
80.0000 mg | Freq: Once | INTRAMUSCULAR | Status: AC
  Administered 2012-09-10: 80 mg via INTRAMUSCULAR

## 2012-09-10 NOTE — Progress Notes (Signed)
  Subjective:    Patient ID: Nathan Farmer, male    DOB: 1966/03/09, 47 y.o.   MRN: [id number]  Sinus Problem This is a new problem. The current episode started today. The problem is unchanged. There has been no fever.   Patient notes persistent congestion. Slight sore throat at times worse in the morning. Clearish nasal discharge. Significant itching of the eyes. Some diminished energy. Positive significant environmental exposures.   Review of Systems ROS otherwise negative    Objective:   Physical Exam  Alert mild malaise. Nasal discharge present eyes injected. Throat some drainage. Neck supple. Lungs clear. Heart regular in rhythm.      Assessment & Plan:  Impression allergic rhinitis unresponsive to current measures. Plan patient declines steroid nasal spray. We'll give steroid injection. Maintain loratadine. Avoidance measures discussed. WSL

## 2012-12-01 ENCOUNTER — Encounter (HOSPITAL_COMMUNITY): Payer: Self-pay | Admitting: *Deleted

## 2012-12-01 ENCOUNTER — Emergency Department (HOSPITAL_COMMUNITY): Payer: Managed Care, Other (non HMO)

## 2012-12-01 ENCOUNTER — Emergency Department (HOSPITAL_COMMUNITY)
Admission: EM | Admit: 2012-12-01 | Discharge: 2012-12-01 | Disposition: A | Discharge status: Home / Self Care | Payer: Managed Care, Other (non HMO) | Attending: Emergency Medicine | Admitting: Emergency Medicine

## 2012-12-01 DIAGNOSIS — S335XXA Sprain of ligaments of lumbar spine, initial encounter: Secondary | ICD-10-CM | POA: Insufficient documentation

## 2012-12-01 DIAGNOSIS — Z79899 Other long term (current) drug therapy: Secondary | ICD-10-CM | POA: Insufficient documentation

## 2012-12-01 DIAGNOSIS — Y939 Activity, unspecified: Secondary | ICD-10-CM | POA: Insufficient documentation

## 2012-12-01 DIAGNOSIS — F17200 Nicotine dependence, unspecified, uncomplicated: Secondary | ICD-10-CM | POA: Insufficient documentation

## 2012-12-01 DIAGNOSIS — R52 Pain, unspecified: Secondary | ICD-10-CM | POA: Insufficient documentation

## 2012-12-01 DIAGNOSIS — X58XXXA Exposure to other specified factors, initial encounter: Secondary | ICD-10-CM | POA: Insufficient documentation

## 2012-12-01 DIAGNOSIS — Y929 Unspecified place or not applicable: Secondary | ICD-10-CM | POA: Insufficient documentation

## 2012-12-01 DIAGNOSIS — G8929 Other chronic pain: Secondary | ICD-10-CM | POA: Insufficient documentation

## 2012-12-01 DIAGNOSIS — M129 Arthropathy, unspecified: Secondary | ICD-10-CM | POA: Insufficient documentation

## 2012-12-01 DIAGNOSIS — S39012A Strain of muscle, fascia and tendon of lower back, initial encounter: Secondary | ICD-10-CM

## 2012-12-01 MED ORDER — HYDROMORPHONE HCL PF 1 MG/ML IJ SOLN
1.0000 mg | Freq: Once | INTRAMUSCULAR | Status: AC
  Administered 2012-12-01: 1 mg via INTRAMUSCULAR
  Filled 2012-12-01: qty 1

## 2012-12-01 MED ORDER — METHOCARBAMOL 500 MG PO TABS: 1000.0000 mg | ORAL_TABLET | Freq: Four times a day (QID) | ORAL | Status: AC

## 2012-12-01 MED ORDER — OXYCODONE-ACETAMINOPHEN 5-325 MG PO TABS: 1.0000 | ORAL_TABLET | ORAL | Status: DC | PRN

## 2012-12-01 MED ORDER — ONDANSETRON 8 MG PO TBDP
8.0000 mg | ORAL_TABLET | Freq: Once | ORAL | Status: AC
  Administered 2012-12-01: 8 mg via ORAL
  Filled 2012-12-01: qty 1

## 2012-12-01 MED ORDER — PREDNISONE 10 MG PO TABS: ORAL_TABLET | ORAL | Status: DC

## 2012-12-01 MED ORDER — PREDNISONE 50 MG PO TABS
60.0000 mg | ORAL_TABLET | Freq: Once | ORAL | Status: AC
  Administered 2012-12-01: 60 mg via ORAL
  Filled 2012-12-01: qty 1

## 2012-12-01 NOTE — ED Notes (Signed)
Pt walking in hall, not in FT at present.

## 2012-12-01 NOTE — ED Notes (Signed)
Back pain , onset when stood up

## 2012-12-01 NOTE — ED Notes (Signed)
No answer when called 

## 2012-12-05 NOTE — ED Provider Notes (Signed)
History    CSN: [id number] Arrival date & time 12/01/12  2035  First MD Initiated Contact with Patient 12/01/12 2127     Chief Complaint  Patient presents with  . Back Pain   (Consider location/radiation/quality/duration/timing/severity/associated sxs/prior Treatment) HPI Comments: Nathan Farmer is a 47 y.o. Male presenting with acute on chronic low back pain which has which started today suddenly when he was simply standing from a sitting position.   Patient denies any new injury specifically.  There is no radiation into his lower extremities.  There has been no weakness or numbness in the lower extremities and no urinary or bowel retention or incontinence.  Patient does not have a history of cancer or IVDU.      The history is provided by the patient.   Past Medical History  Diagnosis Date  . Arthritis    Past Surgical History  Procedure Laterality Date  . Nasal sinus surgery    . Neck surgery    . Hand surgery     History reviewed. No pertinent family history. History  Substance Use Topics  . Smoking status: Current Every Day Smoker -- 1.00 packs/day for 26 years    Types: Cigarettes  . Smokeless tobacco: Never Used  . Alcohol Use: Yes     Comment: twice a year    Review of Systems  Constitutional: Negative for fever.  Respiratory: Negative for shortness of breath.   Cardiovascular: Negative for chest pain and leg swelling.  Gastrointestinal: Negative for abdominal pain, constipation and abdominal distention.  Genitourinary: Negative for dysuria, urgency, frequency, flank pain and difficulty urinating.  Musculoskeletal: Positive for back pain. Negative for joint swelling and gait problem.  Skin: Negative for rash.  Neurological: Negative for weakness and numbness.    Allergies  Asa; Ibuprofen; and Tomato  Home Medications   Current Outpatient Rx  Name  Route  Sig  Dispense  Refill  . ALPRAZolam (XANAX) 1 MG tablet   Oral   Take 1 mg by mouth at bedtime  as needed.         . hydrocodone-acetaminophen (LORCET-HD) 5-500 MG per capsule   Oral   Take 1 capsule by mouth every 6 (six) hours as needed.         . loratadine (CLARITIN) 10 MG tablet   Oral   Take 10 mg by mouth daily.         . pantoprazole (PROTONIX) 40 MG tablet   Oral   Take 40 mg by mouth daily.         . methocarbamol (ROBAXIN) 500 MG tablet   Oral   Take 2 tablets (1,000 mg total) by mouth 4 (four) times daily.   40 tablet   0   . oxyCODONE-acetaminophen (PERCOCET/ROXICET) 5-325 MG per tablet   Oral   Take 1 tablet by mouth every 4 (four) hours as needed for pain.   15 tablet   0   . predniSONE (DELTASONE) 10 MG tablet      6, 5, 4, 3, 2 then 1 tablet by mouth daily for 6 days total.   21 tablet   0    BP 130/83  Pulse 88  Temp(Src) 98.2 F (36.8 C) (Oral)  Resp 20  Ht 6' (1.829 m)  Wt 212 lb (96.163 kg)  BMI 28.75 kg/m2  SpO2 100% Physical Exam  Nursing note and vitals reviewed. Constitutional: He appears well-developed and well-nourished.  HENT:  Head: Normocephalic.  Eyes: Conjunctivae are normal.  Neck: Normal range of motion. Neck supple.  Cardiovascular: Normal rate and intact distal pulses.   Pedal pulses normal.  Pulmonary/Chest: Effort normal.  Abdominal: Soft. Bowel sounds are normal. He exhibits no distension and no mass.  Musculoskeletal: Normal range of motion. He exhibits no edema.       Lumbar back: He exhibits tenderness. He exhibits no swelling, no edema and no spasm.  Paralumbar ttp,  No midline pain, non tender SI joint.  Neurological: He is alert. He has normal strength. He displays no atrophy and no tremor. No sensory deficit. Gait normal.  Reflex Scores:      Patellar reflexes are 2+ on the right side and 2+ on the left side.      Achilles reflexes are 2+ on the right side and 2+ on the left side. No strength deficit noted in hip and knee flexor and extensor muscle groups.  Ankle flexion and extension intact.   Skin: Skin is warm and dry.  Psychiatric: He has a normal mood and affect.    ED Course  Procedures (including critical care time) Labs Reviewed - No data to display No results found. 1. Lumbar strain, initial encounter     MDM  No neuro deficit on exam or by history to suggest emergent or surgical presentation.  Also discussed worsened sx that should prompt immediate re-evaluation including distal weakness, bowel/bladder retention/incontinence.  Acute on chronic low back pain.  Pt was prescribed prednisone taper, robaxin and oxycodone.  Encouraged recheck by pcp if not improved over the next week.        Burgess Amor, PA-C 12/05/12 2310

## 2012-12-06 NOTE — ED Provider Notes (Signed)
Medical screening examination/treatment/procedure(s) were performed by non-physician practitioner and as supervising physician I was immediately available for consultation/collaboration.   Chasta Deshpande L Genavieve Mangiapane, MD 12/06/12 0934 

## 2013-02-19 DIAGNOSIS — Z0289 Encounter for other administrative examinations: Secondary | ICD-10-CM

## 2014-03-19 ENCOUNTER — Emergency Department (HOSPITAL_COMMUNITY)
Admission: EM | Admit: 2014-03-19 | Discharge: 2014-03-19 | Disposition: A | Discharge status: Home / Self Care | Payer: Managed Care, Other (non HMO) | Attending: Emergency Medicine | Admitting: Emergency Medicine

## 2014-03-19 ENCOUNTER — Encounter (HOSPITAL_COMMUNITY): Payer: Self-pay | Admitting: Emergency Medicine

## 2014-03-19 DIAGNOSIS — R519 Headache, unspecified: Secondary | ICD-10-CM

## 2014-03-19 DIAGNOSIS — M1990 Unspecified osteoarthritis, unspecified site: Secondary | ICD-10-CM | POA: Insufficient documentation

## 2014-03-19 DIAGNOSIS — Z720 Tobacco use: Secondary | ICD-10-CM | POA: Insufficient documentation

## 2014-03-19 DIAGNOSIS — R51 Headache: Secondary | ICD-10-CM | POA: Insufficient documentation

## 2014-03-19 DIAGNOSIS — Z792 Long term (current) use of antibiotics: Secondary | ICD-10-CM | POA: Insufficient documentation

## 2014-03-19 DIAGNOSIS — J0100 Acute maxillary sinusitis, unspecified: Secondary | ICD-10-CM | POA: Insufficient documentation

## 2014-03-19 DIAGNOSIS — Z79899 Other long term (current) drug therapy: Secondary | ICD-10-CM | POA: Insufficient documentation

## 2014-03-19 DIAGNOSIS — J320 Chronic maxillary sinusitis: Secondary | ICD-10-CM

## 2014-03-19 MED ORDER — CIPROFLOXACIN HCL 500 MG PO TABS: 500.0000 mg | ORAL_TABLET | Freq: Two times a day (BID) | ORAL | Status: DC

## 2014-03-19 MED ORDER — DEXAMETHASONE SODIUM PHOSPHATE 10 MG/ML IJ SOLN
10.0000 mg | Freq: Once | INTRAMUSCULAR | Status: AC
  Administered 2014-03-19: 10 mg via INTRAMUSCULAR
  Filled 2014-03-19: qty 1

## 2014-03-19 MED ORDER — PREDNISONE 50 MG PO TABS: ORAL_TABLET | ORAL | Status: DC

## 2014-03-19 NOTE — ED Notes (Signed)
Pt reports headache x3 days. Pt reports history of same. Pt also reports light and sound sensitivity. nad noted.

## 2014-03-19 NOTE — Discharge Instructions (Signed)
Prescriptions for prednisone and Cipro 500 mg. Return if worse

## 2014-03-19 NOTE — ED Notes (Signed)
MD at bedside. 

## 2014-03-19 NOTE — ED Provider Notes (Signed)
CSN: [id number]     Arrival date & time 03/19/14  0957 History  This chart was scribed for Donnetta HutchingBrian Felice Hope, MD by Chestine SporeSoijett Blue, ED Scribe. The patient was seen in room APA09/APA09 at 10:05 AM.     Chief Complaint  Patient presents with  . Headache    The history is provided by the patient. No language interpreter was used.  HPI Comments: Nathan Farmer is a 48 y.o. male who presents to the Emergency Department complaining of HA onset 3 days. He states that he has a medical hx of the same ailment. He states that he thinks that this HA is due to his allergy issues. He states that he used to get a steroid shot and abx from his PCP and it will go away in 2 days.  He states that his HA is on his right upper cheek and surrounding his eye. He states that he has not ate or slept in three days.  He states that he is having associated symptoms of photophobia and sound sensitivity and cough. He denies any other symptoms. He states that he does not have insurance. He states that he used to see Dr. Gerda DissLuking. 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   Past Medical History  Diagnosis Date  . Arthritis    Past Surgical History  Procedure Laterality Date  . Nasal sinus surgery    . Neck surgery    . Hand surgery     History reviewed. No pertinent family history. History  Substance Use Topics  . Smoking status: Current Every Day Smoker -- 1.00 packs/day for 26 years    Types: Cigarettes  . Smokeless tobacco: Never Used  . Alcohol Use: Yes     Comment: twice a year    Review of Systems  Eyes: Positive for photophobia.  Respiratory: Positive for cough.   Neurological: Positive for headaches.  All other systems reviewed and are negative.   Allergies  Asa; Ibuprofen; and Tomato  Home Medications   Prior to Admission medications   Medication Sig Start Date End Date Taking? Authorizing Provider  ALPRAZolam Prudy Feeler(XANAX) 1 MG tablet Take 1 mg by mouth at bedtime as needed.    Historical  Provider, MD  ciprofloxacin (CIPRO) 500 MG tablet Take 1 tablet (500 mg total) by mouth 2 (two) times daily. 03/19/14   Donnetta HutchingBrian Samamtha Tiegs, MD  hydrocodone-acetaminophen (LORCET-HD) 5-500 MG per capsule Take 1 capsule by mouth every 6 (six) hours as needed.    Historical Provider, MD  loratadine (CLARITIN) 10 MG tablet Take 10 mg by mouth daily.    Historical Provider, MD  oxyCODONE-acetaminophen (PERCOCET/ROXICET) 5-325 MG per tablet Take 1 tablet by mouth every 4 (four) hours as needed for pain. 12/01/12   Burgess AmorJulie Idol, PA-C  pantoprazole (PROTONIX) 40 MG tablet Take 40 mg by mouth daily.    Historical Provider, MD  predniSONE (DELTASONE) 10 MG tablet 6, 5, 4, 3, 2 then 1 tablet by mouth daily for 6 days total. 12/01/12   Burgess AmorJulie Idol, PA-C  predniSONE (DELTASONE) 50 MG tablet 1 tablet daily for 4 days, one half tablet daily for 4 days 03/19/14   Donnetta HutchingBrian Abir Craine, MD   BP 154/97  Pulse 88  Temp(Src) 98.3 F (36.8 C) (Oral)  Resp 18  Ht 6' (1.829 m)  Wt 250 lb (113.399 kg)  BMI 33.90 kg/m2  SpO2 100%  Physical Exam  Nursing note and vitals reviewed. Constitutional: He is oriented to person, place, and  time. He appears well-developed and well-nourished.  HENT:  Head: Normocephalic and atraumatic.  Tender over the right maxillary sinus.   Eyes: Conjunctivae and EOM are normal. Pupils are equal, round, and reactive to light.  Neck: Normal range of motion. Neck supple.  Cardiovascular: Normal rate, regular rhythm and normal heart sounds.   Pulmonary/Chest: Effort normal and breath sounds normal.  Abdominal: Soft. Bowel sounds are normal.  Musculoskeletal: Normal range of motion.  Neurological: He is alert and oriented to person, place, and time.  Skin: Skin is warm and dry.  Psychiatric: He has a normal mood and affect. His behavior is normal.    ED Course  Procedures (including critical care time) DIAGNOSTIC STUDIES: Oxygen Saturation is 100% on room air, normal by my interpretation.     COORDINATION OF CARE: 10:10 AM-Discussed treatment plan which includes Ciprofloxacin, steroid shot, and prednisone pills with pt at bedside and pt agreed to plan.   Labs Review Labs Reviewed - No data to display  Imaging Review No results found.   EKG Interpretation None      MDM   Final diagnoses:  Right maxillary sinusitis  Nonintractable headache, unspecified chronicity pattern, unspecified headache type    History and physical consistent with right maxillary sinusitis. Patient states steroids work well. He has taken Cipro in the past for the same condition which helps. Rx IM Decadron; discharge medications Cipro 500 mg and prednisone 50 mg  I personally performed the services described in this documentation, which was scribed in my presence. The recorded information has been reviewed and is accurate.    Donnetta HutchingBrian Sekai Nayak, MD 03/19/14 1027

## 2014-12-05 ENCOUNTER — Encounter (HOSPITAL_COMMUNITY): Payer: Self-pay | Admitting: Emergency Medicine

## 2014-12-05 ENCOUNTER — Emergency Department (HOSPITAL_COMMUNITY)
Admission: EM | Admit: 2014-12-05 | Discharge: 2014-12-05 | Disposition: A | Discharge status: Home / Self Care | Payer: Managed Care, Other (non HMO) | Attending: Emergency Medicine | Admitting: Emergency Medicine

## 2014-12-05 DIAGNOSIS — G8929 Other chronic pain: Secondary | ICD-10-CM

## 2014-12-05 DIAGNOSIS — M549 Dorsalgia, unspecified: Secondary | ICD-10-CM | POA: Insufficient documentation

## 2014-12-05 DIAGNOSIS — Z79899 Other long term (current) drug therapy: Secondary | ICD-10-CM | POA: Insufficient documentation

## 2014-12-05 DIAGNOSIS — R51 Headache: Secondary | ICD-10-CM

## 2014-12-05 DIAGNOSIS — Z720 Tobacco use: Secondary | ICD-10-CM | POA: Insufficient documentation

## 2014-12-05 DIAGNOSIS — R519 Headache, unspecified: Secondary | ICD-10-CM

## 2014-12-05 DIAGNOSIS — K088 Other specified disorders of teeth and supporting structures: Secondary | ICD-10-CM | POA: Insufficient documentation

## 2014-12-05 DIAGNOSIS — K0889 Other specified disorders of teeth and supporting structures: Secondary | ICD-10-CM

## 2014-12-05 DIAGNOSIS — F419 Anxiety disorder, unspecified: Secondary | ICD-10-CM | POA: Insufficient documentation

## 2014-12-05 DIAGNOSIS — M542 Cervicalgia: Secondary | ICD-10-CM | POA: Insufficient documentation

## 2014-12-05 HISTORY — DX: Dorsopathy, unspecified: M53.9

## 2014-12-05 MED ORDER — PENICILLIN V POTASSIUM 250 MG PO TABS
500.0000 mg | ORAL_TABLET | Freq: Once | ORAL | Status: AC
  Administered 2014-12-05: 500 mg via ORAL
  Filled 2014-12-05: qty 2

## 2014-12-05 MED ORDER — DIPHENHYDRAMINE HCL 50 MG/ML IJ SOLN
50.0000 mg | Freq: Once | INTRAMUSCULAR | Status: AC
  Administered 2014-12-05: 50 mg via INTRAMUSCULAR
  Filled 2014-12-05: qty 1

## 2014-12-05 MED ORDER — PENICILLIN V POTASSIUM 500 MG PO TABS: 500.0000 mg | ORAL_TABLET | Freq: Four times a day (QID) | ORAL | Status: AC

## 2014-12-05 MED ORDER — METOCLOPRAMIDE HCL 5 MG/ML IJ SOLN
10.0000 mg | Freq: Once | INTRAMUSCULAR | Status: AC
  Administered 2014-12-05: 10 mg via INTRAMUSCULAR
  Filled 2014-12-05: qty 2

## 2014-12-05 NOTE — Discharge Instructions (Signed)

## 2014-12-05 NOTE — ED Provider Notes (Signed)
CSN: [id number]     Arrival date & time 12/05/14  0725 History   First MD Initiated Contact with Patient 12/05/14 [phone]     Chief Complaint  Patient presents with  . Dental Pain     Patient is a 49 y.o. male presenting with headaches. The history is provided by the patient.  Headache Pain location:  Occipital Quality:  Sharp Onset quality:  Gradual Duration:  1 day Timing:  Constant Progression:  Worsening Chronicity:  New Relieved by:  Nothing Worsened by:  Nothing Associated symptoms: back pain and neck pain   Associated symptoms: no abdominal pain, no blurred vision, no fever, no focal weakness, no neck stiffness and no weakness   Associated symptoms comment:  Pt with chronic neck and back pain at baseline  pt presents for multiple complaints:  1. Left upper dental pain for several days.  No fever reported.  No facial swelling reported 2. Occipital HA for one day - gradual in onset.  No focal weakness reported 3. He reports chronic neck and back pain that is unchanged.  He mentions numbness to right hand for over a month and PCP thinks it is carpal tunnel 4. He reports panic attack last night but this is improved  Denies h/o CAD/CVA  Past Medical History  Diagnosis Date  . Arthritis   . Multilevel degenerative disc disease    Past Surgical History  Procedure Laterality Date  . Nasal sinus surgery    . Neck surgery    . Hand surgery     History reviewed. No pertinent family history. History  Substance Use Topics  . Smoking status: Current Every Day Smoker -- 1.00 packs/day for 26 years    Types: Cigarettes  . Smokeless tobacco: Never Used  . Alcohol Use: Yes     Comment: twice a year    Review of Systems  Constitutional: Negative for fever.  Eyes: Negative for blurred vision.  Gastrointestinal: Negative for abdominal pain.  Musculoskeletal: Positive for back pain and neck pain. Negative for neck stiffness.  Neurological: Positive for headaches. Negative for  focal weakness and weakness.  Psychiatric/Behavioral: The patient is nervous/anxious.   All other systems reviewed and are negative.     Allergies  Asa; Ibuprofen; and Tomato  Home Medications   Prior to Admission medications   Medication Sig Start Date End Date Taking? Authorizing Provider  ALPRAZolam Prudy Feeler(XANAX) 1 MG tablet Take 1 mg by mouth at bedtime as needed.    Historical Provider, MD  loratadine (CLARITIN) 10 MG tablet Take 10 mg by mouth daily.    Historical Provider, MD  pantoprazole (PROTONIX) 40 MG tablet Take 40 mg by mouth daily.    Historical Provider, MD   BP 137/92 mmHg  Pulse 120  Temp(Src) 98.3 F (36.8 C) (Oral)  Resp 18  Ht 6' (1.829 m)  Wt 220 lb (99.791 kg)  BMI 29.83 kg/m2  SpO2 98% repeat HR 118 while I am in room Physical Exam CONSTITUTIONAL: Well developed/well nourished HEAD: Normocephalic/atraumatic EYES: EOMI/PERRL, no nystagmus, no ptosis ENMT: Mucous membranes moist, poor dentition, diffuse tenderness to left upper teeth, no abscess noted, no trismus noted NECK: supple no meningeal signs, no bruits SPINE/BACK:entire spine nontender CV: S1/S2 noted, no murmurs/rubs/gallops noted LUNGS: Lungs are clear to auscultation bilaterally, no apparent distress ABDOMEN: soft, nontender, no rebound or guarding GU:no cva tenderness NEURO:Awake/alert, facies symmetric, no arm or leg drift is noted Equal 5/5 strength with shoulder abduction, elbow flex/extension, wrist flex/extension in upper extremities  Equal 5/5 strength with hip flexion,knee flex/extension Gait normal without ataxia No past pointing No sensory deficit in upper/lower extremities EXTREMITIES: pulses normal, full ROM SKIN: warm, color normal PSYCH: no abnormalities of mood noted, alert and oriented to situation   ED Course  Procedures  Pt improved Resting comfortably No neuro deficits noted I doubt CVA/SAH or other acute neuro process causing HA Needs dental f/u, PCN ordered All  other issues (neck/back pain) are chronic and no neuro deficits Discussed strict return precautions with patient/family member   MDM   Final diagnoses:  Acute nonintractable headache, unspecified headache type  Pain, dental  Chronic neck pain  Chronic back pain    Nursing notes including past medical history and social history reviewed and considered in documentation     Zadie Rhine, MD 12/05/14 [phone]

## 2014-12-05 NOTE — ED Notes (Signed)
Multiple complaints. Pt reports dental pain and headache since yesterday. Pt reports upper back and neck pain x1 year. Pt also reports bilateral hand numbness x1 month. nad noted.

## 2014-12-05 NOTE — ED Notes (Signed)
EDP at bedside  

## 2016-02-05 ENCOUNTER — Emergency Department (HOSPITAL_COMMUNITY): Payer: Managed Care, Other (non HMO)

## 2016-02-05 ENCOUNTER — Encounter (HOSPITAL_COMMUNITY): Payer: Self-pay | Admitting: Emergency Medicine

## 2016-02-05 ENCOUNTER — Emergency Department (HOSPITAL_COMMUNITY)
Admission: EM | Admit: 2016-02-05 | Discharge: 2016-02-05 | Disposition: A | Discharge status: Home / Self Care | Payer: Managed Care, Other (non HMO) | Attending: Emergency Medicine | Admitting: Emergency Medicine

## 2016-02-05 DIAGNOSIS — F17210 Nicotine dependence, cigarettes, uncomplicated: Secondary | ICD-10-CM | POA: Insufficient documentation

## 2016-02-05 DIAGNOSIS — Z79899 Other long term (current) drug therapy: Secondary | ICD-10-CM | POA: Insufficient documentation

## 2016-02-05 DIAGNOSIS — Z79891 Long term (current) use of opiate analgesic: Secondary | ICD-10-CM | POA: Insufficient documentation

## 2016-02-05 DIAGNOSIS — H81399 Other peripheral vertigo, unspecified ear: Secondary | ICD-10-CM | POA: Insufficient documentation

## 2016-02-05 HISTORY — DX: Chronic sinusitis, unspecified: J32.9

## 2016-02-05 HISTORY — DX: Carpal tunnel syndrome, bilateral upper limbs: G56.03

## 2016-02-05 HISTORY — DX: Dorsalgia, unspecified: M54.2

## 2016-02-05 HISTORY — DX: Radiculopathy, cervical region: M54.12

## 2016-02-05 HISTORY — DX: Other chronic pain: G89.29

## 2016-02-05 HISTORY — DX: Chronic pain syndrome: G89.4

## 2016-02-05 HISTORY — DX: Dorsalgia, unspecified: M54.9

## 2016-02-05 LAB — BASIC METABOLIC PANEL
Anion gap: 7 (ref 5–15)
BUN: 12 mg/dL (ref 6–20)
CALCIUM: 9.6 mg/dL (ref 8.9–10.3)
CHLORIDE: 103 mmol/L (ref 101–111)
CO2: 27 mmol/L (ref 22–32)
CREATININE: 0.88 mg/dL (ref 0.61–1.24)
GFR calc Af Amer: >60 mL/min (ref >60)
GFR calc non Af Amer: >60 mL/min (ref >60)
GLUCOSE: 97 mg/dL (ref 65–99)
Potassium: 4.3 mmol/L (ref 3.5–5.1)
Sodium: 137 mmol/L (ref 135–145)

## 2016-02-05 LAB — URINALYSIS, ROUTINE W REFLEX MICROSCOPIC
BILIRUBIN URINE: NEGATIVE
GLUCOSE, UA: NEGATIVE mg/dL
HGB URINE DIPSTICK: NEGATIVE
Ketones, ur: NEGATIVE mg/dL
Leukocytes, UA: NEGATIVE
Nitrite: NEGATIVE
PROTEIN: NEGATIVE mg/dL
SPECIFIC GRAVITY, URINE: 1.01 (ref 1.005–1.030)
pH: 6.5 (ref 5.0–8.0)

## 2016-02-05 LAB — CBC
HCT: 45.6 % (ref 39.0–52.0)
Hemoglobin: 15.4 g/dL (ref 13.0–17.0)
MCH: 30.4 pg (ref 26.0–34.0)
MCHC: 33.8 g/dL (ref 30.0–36.0)
MCV: 89.9 fL (ref 78.0–100.0)
PLATELETS: 290 10*3/uL (ref 150–400)
RBC: 5.07 MIL/uL (ref 4.22–5.81)
RDW: 13.7 % (ref 11.5–15.5)
WBC: 10.7 10*3/uL — ABNORMAL HIGH (ref 4.0–10.5)

## 2016-02-05 LAB — TROPONIN I: Troponin I: <0.03 ng/mL (ref <0.03)

## 2016-02-05 MED ORDER — MECLIZINE HCL 12.5 MG PO TABS
50.0000 mg | ORAL_TABLET | Freq: Once | ORAL | Status: AC
  Administered 2016-02-05: 50 mg via ORAL
  Filled 2016-02-05: qty 4

## 2016-02-05 MED ORDER — MECLIZINE HCL 25 MG PO TABS: 25.0000 mg | ORAL_TABLET | Freq: Three times a day (TID) | ORAL | 0 refills | Status: AC | PRN

## 2016-02-05 NOTE — ED Provider Notes (Signed)
[facility]-EMERGENCY DEPT Provider Note   CSN: [id number] Arrival date & time: 02/05/16  0902     History   Chief Complaint Chief Complaint  Patient presents with  . Dizziness    HPI Nathan Farmer is a 50 y.o. male.  HPI  Pt was seen at 0955. Per pt, c/o gradual onset and persistence of constant "dizziness" that he noticed when he woke up this morning. Pt states he feels like "everything is moving" and he felt "off balance" when he leaned over to put on his pants. Symptoms worsen when he turns his head side to side and body position changes. Denies CP/palpitations, no SOB/cough, no abd pain, no N/V/D, no visual changes, no focal motor weakness, no tingling/numbness in extremities, no slurred speech, no facial droop.    Past Medical History:  Diagnosis Date  . Arthritis   . Carpal tunnel syndrome, bilateral   . Cervical radiculopathy   . Chronic neck and back pain   . Chronic pain syndrome   . Chronic sinusitis   . Multilevel degenerative disc disease     Patient Active Problem List   Diagnosis Date Noted  . Spasm of paraspinal muscle 06/26/2012  . Stiffness of joint, not elsewhere classified, other specified site 06/26/2012    Past Surgical History:  Procedure Laterality Date  . HAND SURGERY    . NASAL SINUS SURGERY    . NECK SURGERY         Home Medications    Prior to Admission medications   Medication Sig Start Date End Date Taking? Authorizing Provider  ALPRAZolam Prudy Feeler(XANAX) 1 MG tablet Take 1 mg by mouth at bedtime as needed for anxiety.    Yes Historical Provider, MD  citalopram (CELEXA) 40 MG tablet Take 40 mg by mouth at bedtime. 10/25/15  Yes Historical Provider, MD  loratadine (CLARITIN) 10 MG tablet Take 10 mg by mouth daily.   Yes Historical Provider, MD  oxyCODONE-acetaminophen (PERCOCET) 7.5-325 MG tablet Take 1 tablet by mouth every 12 (twelve) hours as needed for pain. 01/07/16  Yes Historical Provider, MD    Family History Family History  Problem  Relation Age of Onset  . Cancer Mother   . Heart attack Father     Social History Social History  Substance Use Topics  . Smoking status: Current Every Day Smoker    Packs/day: 1.00    Years: 26.00    Types: Cigarettes  . Smokeless tobacco: Never Used  . Alcohol use Yes     Comment: twice a year     Allergies   Asa [aspirin]; Ibuprofen; and Tomato   Review of Systems Review of Systems ROS: Statement: All systems negative except as marked or noted in the HPI; Constitutional: Negative for fever and chills. ; ; Eyes: Negative for eye pain, redness and discharge. ; ; ENMT: Negative for ear pain, hoarseness, nasal congestion, sinus pressure and sore throat. ; ; Cardiovascular: Negative for chest pain, palpitations, diaphoresis, dyspnea and peripheral edema. ; ; Respiratory: Negative for cough, wheezing and stridor. ; ; Gastrointestinal: Negative for nausea, vomiting, diarrhea, abdominal pain, blood in stool, hematemesis, jaundice and rectal bleeding. . ; ; Genitourinary: Negative for dysuria, flank pain and hematuria. ; ; Musculoskeletal: Negative for back pain and neck pain. Negative for swelling and trauma.; ; Skin: Negative for pruritus, rash, abrasions, blisters, bruising and skin lesion.; ; Neuro: +everything is moving," "off balance." Negative for headache, lightheadedness and neck stiffness. Negative for weakness, altered level of consciousness, altered mental  status, extremity weakness, paresthesias, involuntary movement, seizure and syncope.       Physical Exam Updated Vital Signs BP 117/87 (BP Location: Right Arm)   Pulse 62   Temp 97.8 F (36.6 C) (Oral)   Resp 10   Ht 6' (1.829 m)   Wt 225 lb (102.1 kg)   SpO2 100%   BMI 30.52 kg/m     11:25:41 Orthostatic Vital Signs EW  Orthostatic Lying   BP- Lying: 124/81  Pulse- Lying: 75      Orthostatic Sitting  BP- Sitting: 120/84  Pulse- Sitting: 86      Orthostatic Standing at 0 minutes  BP- Standing at 0  minutes: 132/88  Pulse- Standing at 0 minutes: 89      Physical Exam 1000: Physical examination:  Nursing notes reviewed; Vital signs and O2 SAT reviewed;  Constitutional: Well developed, Well nourished, Well hydrated, In no acute distress; Head:  Normocephalic, atraumatic; Eyes: EOMI, PERRL, No scleral icterus; ENMT: Mouth and pharynx normal, Mucous membranes moist. +edemetous nasal turbinates bilat with clear rhinorrhea.; Neck: Supple, Full range of motion, No lymphadenopathy; Cardiovascular: Regular rate and rhythm, No gallop; Respiratory: Breath sounds clear & equal bilaterally, No wheezes.  Speaking full sentences with ease, Normal respiratory effort/excursion; Chest: Nontender, Movement normal; Abdomen: Soft, Nontender, Nondistended, Normal bowel sounds; Genitourinary: No CVA tenderness; Extremities: Pulses normal, No tenderness, No edema, No calf edema or asymmetry.; Neuro: AA&Ox3, Major CN grossly intact. Speech clear.  No facial droop.  +left horizontal end gaze fatigable nystagmus. Grips equal. Strength 5/5 equal bilat UE's and LE's.  DTR 2/4 equal bilat UE's and LE's.  No gross sensory deficits.  Normal cerebellar testing bilat UE's (finger-nose) and LE's (heel-shin)..; Skin: Color normal, Warm, Dry.   ED Treatments / Results  Labs (all labs ordered are listed, but only abnormal results are displayed)   EKG  EKG Interpretation  Date/Time:  Sunday February 05 2016 09:21:51 EDT Ventricular Rate:  78 PR Interval:    QRS Duration: 108 QT Interval:  399 QTC Calculation: 455 R Axis:   124 Text Interpretation:  Sinus rhythm Probable right ventricular hypertrophy No old tracing to compare Confirmed by [facility]  MD, Nicholos Johns [phone]) on 02/05/2016 10:05:28 AM       Radiology   Procedures Procedures (including critical care time)  Medications Ordered in ED Medications  meclizine (ANTIVERT) tablet 50 mg (50 mg Oral Given 02/05/16 1013)     Initial Impression / Assessment and  Plan / ED Course  I have reviewed the triage vital signs and the nursing notes.  Pertinent labs & imaging results that were available during my care of the patient were reviewed by me and considered in my medical decision making (see chart for details).  MDM Reviewed: previous chart, nursing note and vitals Reviewed previous: labs and ECG Interpretation: labs, ECG and CT scan   Results for orders placed or performed during the hospital encounter of 02/05/16  Basic metabolic panel  Result Value Ref Range   Sodium 137 135 - 145 mmol/L   Potassium 4.3 3.5 - 5.1 mmol/L   Chloride 103 101 - 111 mmol/L   CO2 27 22 - 32 mmol/L   Glucose, Bld 97 65 - 99 mg/dL   BUN 12 6 - 20 mg/dL   Creatinine, Ser 1.91 0.61 - 1.24 mg/dL   Calcium 9.6 8.9 - 47.8 mg/dL   GFR calc non Af Amer >60 >60 mL/min   GFR calc Af Amer >60 >60 mL/min  Anion gap 7 5 - 15  CBC  Result Value Ref Range   WBC 10.7 (H) 4.0 - 10.5 K/uL   RBC 5.07 4.22 - 5.81 MIL/uL   Hemoglobin 15.4 13.0 - 17.0 g/dL   HCT 16.1 09.6 - 04.5 %   MCV 89.9 78.0 - 100.0 fL   MCH 30.4 26.0 - 34.0 pg   MCHC 33.8 30.0 - 36.0 g/dL   RDW 40.9 81.1 - 91.4 %   Platelets 290 150 - 400 K/uL  Urinalysis, Routine w reflex microscopic  Result Value Ref Range   Color, Urine YELLOW YELLOW   APPearance CLEAR CLEAR   Specific Gravity, Urine 1.010 1.005 - 1.030   pH 6.5 5.0 - 8.0   Glucose, UA NEGATIVE NEGATIVE mg/dL   Hgb urine dipstick NEGATIVE NEGATIVE   Bilirubin Urine NEGATIVE NEGATIVE   Ketones, ur NEGATIVE NEGATIVE mg/dL   Protein, ur NEGATIVE NEGATIVE mg/dL   Nitrite NEGATIVE NEGATIVE   Leukocytes, UA NEGATIVE NEGATIVE  Troponin I  Result Value Ref Range   Troponin I <0.03 <0.03 ng/mL   Ct Head Wo Contrast Result Date: 02/05/2016 CLINICAL DATA:  Woke up with dizziness and blurred vision, was fine last night when he went to sleep, dizziness increased with sudden movement and bending over, near syncopal episode, smoker EXAM: CT HEAD  WITHOUT CONTRAST TECHNIQUE: Contiguous axial images were obtained from the base of the skull through the vertex without intravenous contrast. COMPARISON:  None FINDINGS: Normal ventricular morphology. No midline shift or mass effect. Few beam hardening artifacts at skullbase. Normal appearance of brain parenchyma. No intracranial hemorrhage, mass lesion, evidence of acute infarction or extra-axial fluid collections. Prior sinus surgery. Mastoid air cells clear. Osseous structures unremarkable. IMPRESSION: No acute intracranial abnormalities. Prior sinus surgery. Electronically Signed   By: Ulyses Southward M.D.   On: 02/05/2016 10:43    1235:  Pt feels better after meds and wants to go home now. Workup reassuring. Not orthostatic on VS. Pt has ambulated with steady gait, easy resps, NAD. Tx symptomatically at this time. Dx and testing d/w pt.  Questions answered.  Verb understanding, agreeable to d/c home with outpt f/u.  Final Clinical Impressions(s) / ED Diagnoses   Final diagnoses:  None    New Prescriptions New Prescriptions   No medications on file     Samuel Jester, DO 02/08/16 1702

## 2016-02-05 NOTE — Discharge Instructions (Signed)
Take the prescription as directed.  Call your regular medical doctor and the ENT doctor tomorrow to schedule a follow up appointment this week.  Return to the Emergency Department immediately sooner if worsening.

## 2016-02-05 NOTE — ED Triage Notes (Addendum)
Patient c/o waking with dizziness and blurred vision. Per patient was fine last night when he went to sleep. Denies any weakness, slurred speech, headache, or facial drooping. No deficts noted. Patient states dizziness is worse with sudden movements and bending over. Patient reports near syncopal episode with bending. Denies any nausea or vomiting.

## 2016-02-05 NOTE — ED Notes (Signed)
Patient ambulated around nurses station without difficulty. MD made aware.

## 2016-06-06 ENCOUNTER — Emergency Department (HOSPITAL_COMMUNITY)
Admission: EM | Admit: 2016-06-06 | Discharge: 2016-06-06 | Disposition: A | Discharge status: Home / Self Care | Payer: Self-pay | Attending: Emergency Medicine | Admitting: Emergency Medicine

## 2016-06-06 ENCOUNTER — Emergency Department (HOSPITAL_COMMUNITY): Payer: Self-pay

## 2016-06-06 ENCOUNTER — Encounter (HOSPITAL_COMMUNITY): Payer: Self-pay | Admitting: Emergency Medicine

## 2016-06-06 DIAGNOSIS — Z79899 Other long term (current) drug therapy: Secondary | ICD-10-CM | POA: Insufficient documentation

## 2016-06-06 DIAGNOSIS — K8070 Calculus of gallbladder and bile duct without cholecystitis without obstruction: Secondary | ICD-10-CM | POA: Insufficient documentation

## 2016-06-06 DIAGNOSIS — F17210 Nicotine dependence, cigarettes, uncomplicated: Secondary | ICD-10-CM | POA: Insufficient documentation

## 2016-06-06 DIAGNOSIS — K8020 Calculus of gallbladder without cholecystitis without obstruction: Secondary | ICD-10-CM

## 2016-06-06 DIAGNOSIS — K8050 Calculus of bile duct without cholangitis or cholecystitis without obstruction: Secondary | ICD-10-CM

## 2016-06-06 LAB — CBC WITH DIFFERENTIAL/PLATELET
BASOS ABS: 0.2 10*3/uL — AB (ref 0.0–0.1)
Basophils Relative: 1 %
EOS ABS: 0.2 10*3/uL (ref 0.0–0.7)
Eosinophils Relative: 1 %
HEMATOCRIT: 52 % (ref 39.0–52.0)
Hemoglobin: 17.6 g/dL — ABNORMAL HIGH (ref 13.0–17.0)
LYMPHS PCT: 36 %
Lymphs Abs: 6.3 10*3/uL — ABNORMAL HIGH (ref 0.7–4.0)
MCH: 30.1 pg (ref 26.0–34.0)
MCHC: 33.8 g/dL (ref 30.0–36.0)
MCV: 89 fL (ref 78.0–100.0)
MONOS PCT: 7 %
Monocytes Absolute: 1.2 10*3/uL — ABNORMAL HIGH (ref 0.1–1.0)
Neutro Abs: 9.7 10*3/uL — ABNORMAL HIGH (ref 1.7–7.7)
Neutrophils Relative %: 55 %
Platelets: 314 10*3/uL (ref 150–400)
RBC: 5.84 MIL/uL — AB (ref 4.22–5.81)
RDW: 13.9 % (ref 11.5–15.5)
WBC: 17.6 10*3/uL — AB (ref 4.0–10.5)

## 2016-06-06 LAB — COMPREHENSIVE METABOLIC PANEL
ALBUMIN: 4.4 g/dL (ref 3.5–5.0)
ALK PHOS: 61 U/L (ref 38–126)
ALT: 31 U/L (ref 17–63)
AST: 19 U/L (ref 15–41)
Anion gap: 8 (ref 5–15)
BUN: 15 mg/dL (ref 6–20)
CALCIUM: 9.7 mg/dL (ref 8.9–10.3)
CHLORIDE: 102 mmol/L (ref 101–111)
CO2: 27 mmol/L (ref 22–32)
Creatinine, Ser: 1.08 mg/dL (ref 0.61–1.24)
GFR calc non Af Amer: >60 mL/min (ref >60)
GLUCOSE: 135 mg/dL — AB (ref 65–99)
Potassium: 4.3 mmol/L (ref 3.5–5.1)
SODIUM: 137 mmol/L (ref 135–145)
Total Bilirubin: 0.5 mg/dL (ref 0.3–1.2)
Total Protein: 7.7 g/dL (ref 6.5–8.1)

## 2016-06-06 LAB — TROPONIN I

## 2016-06-06 LAB — LIPASE, BLOOD: Lipase: 58 U/L — ABNORMAL HIGH (ref 11–51)

## 2016-06-06 MED ORDER — ONDANSETRON HCL 4 MG PO TABS: 4.0000 mg | ORAL_TABLET | Freq: Three times a day (TID) | ORAL | 0 refills | Status: AC | PRN

## 2016-06-06 MED ORDER — ONDANSETRON HCL 4 MG/2ML IJ SOLN
4.0000 mg | Freq: Once | INTRAMUSCULAR | Status: AC
  Administered 2016-06-06: 4 mg via INTRAVENOUS
  Filled 2016-06-06: qty 2

## 2016-06-06 MED ORDER — SODIUM CHLORIDE 0.9 % IV BOLUS (SEPSIS)
1000.0000 mL | Freq: Once | INTRAVENOUS | Status: AC
  Administered 2016-06-06: 1000 mL via INTRAVENOUS

## 2016-06-06 MED ORDER — HYDROCODONE-ACETAMINOPHEN 5-325 MG PO TABS: ORAL_TABLET | ORAL | 0 refills | Status: AC

## 2016-06-06 MED ORDER — ONDANSETRON 4 MG PO TBDP
4.0000 mg | ORAL_TABLET | Freq: Once | ORAL | Status: AC | PRN
  Administered 2016-06-06: 4 mg via ORAL
  Filled 2016-06-06: qty 1

## 2016-06-06 MED ORDER — FAMOTIDINE IN NACL 20-0.9 MG/50ML-% IV SOLN
20.0000 mg | Freq: Once | INTRAVENOUS | Status: AC
  Administered 2016-06-06: 20 mg via INTRAVENOUS
  Filled 2016-06-06: qty 50

## 2016-06-06 MED ORDER — IOPAMIDOL (ISOVUE-370) INJECTION 76%
100.0000 mL | Freq: Once | INTRAVENOUS | Status: AC | PRN
  Administered 2016-06-06: 100 mL via INTRAVENOUS

## 2016-06-06 MED ORDER — HYDROMORPHONE HCL 1 MG/ML IJ SOLN
1.0000 mg | Freq: Once | INTRAMUSCULAR | Status: AC
  Administered 2016-06-06: 1 mg via INTRAVENOUS
  Filled 2016-06-06: qty 1

## 2016-06-06 MED ORDER — FENTANYL CITRATE (PF) 100 MCG/2ML IJ SOLN
50.0000 ug | INTRAMUSCULAR | Status: DC | PRN
  Administered 2016-06-06: 50 ug via INTRAVENOUS
  Filled 2016-06-06: qty 2

## 2016-06-06 NOTE — ED Notes (Signed)
Pt still in CT

## 2016-06-06 NOTE — ED Provider Notes (Signed)
[id number]:   Pt received at sign out with US pending. US as below. LFT's normal. Pt continues to c/o RUQ pain. Will remedicate.  1050:  Pt has received multiple doses of IV dilaudid, zofran, and now pepcid and fentanyl. Continues to c/o 8/10 pain. WBC count elevated, but pt afebrile. CXR and CT-A otherwise reassuring. T/C to General Surgery Dr. Earlene Plateravis, case discussed, including:  HPI, pertinent PM/SHx, VS/PE, dx testing, ED course and treatment:  Agreeable to consult.  1135:  General Surgeon has evaluated pt in the ED: pt wants to go home now, tx biliary colic symptomatically, f/u outpatient. Dx and testing d/w pt.  Questions answered.  Verb understanding, agreeable to d/c home with outpt f/u.    MDM Reviewed: previous chart, nursing note and vitals Reviewed previous: labs and x-ray Interpretation: ultrasound   Results for orders placed or performed during the hospital encounter of 06/06/16  CBC with Differential  Result Value Ref Range   WBC 17.6 (H) 4.0 - 10.5 K/uL   RBC 5.84 (H) 4.22 - 5.81 MIL/uL   Hemoglobin 17.6 (H) 13.0 - 17.0 g/dL   HCT 96.052.0 45.439.0 - 09.852.0 %   MCV 89.0 78.0 - 100.0 fL   MCH 30.1 26.0 - 34.0 pg   MCHC 33.8 30.0 - 36.0 g/dL   RDW 11.913.9 14.711.5 - 82.915.5 %   Platelets 314 150 - 400 K/uL   Neutrophils Relative % 55 %   Lymphocytes Relative 36 %   Monocytes Relative 7 %   Eosinophils Relative 1 %   Basophils Relative 1 %   Neutro Abs 9.7 (H) 1.7 - 7.7 K/uL   Lymphs Abs 6.3 (H) 0.7 - 4.0 K/uL   Monocytes Absolute 1.2 (H) 0.1 - 1.0 K/uL   Eosinophils Absolute 0.2 0.0 - 0.7 K/uL   Basophils Absolute 0.2 (H) 0.0 - 0.1 K/uL   WBC Morphology ATYPICAL LYMPHOCYTES   Troponin I  Result Value Ref Range   Troponin I <0.03 <0.03 ng/mL  Comprehensive metabolic panel  Result Value Ref Range   Sodium 137 135 - 145 mmol/L   Potassium 4.3 3.5 - 5.1 mmol/L   Chloride 102 101 - 111 mmol/L   CO2 27 22 - 32 mmol/L   Glucose, Bld 135 (H) 65 - 99 mg/dL   BUN 15 6 - 20 mg/dL   Creatinine,  Ser 5.621.08 0.61 - 1.24 mg/dL   Calcium 9.7 8.9 - 13.010.3 mg/dL   Total Protein 7.7 6.5 - 8.1 g/dL   Albumin 4.4 3.5 - 5.0 g/dL   AST 19 15 - 41 U/L   ALT 31 17 - 63 U/L   Alkaline Phosphatase 61 38 - 126 U/L   Total Bilirubin 0.5 0.3 - 1.2 mg/dL   GFR calc non Af Amer >60 >60 mL/min   GFR calc Af Amer >60 >60 mL/min   Anion gap 8 5 - 15  Lipase, blood  Result Value Ref Range   Lipase 58 (H) 11 - 51 U/L   Dg Chest 2 View Result Date: 06/06/2016 CLINICAL DATA:  Chest and epigastric pain EXAM: CHEST  2 VIEW COMPARISON:  None. FINDINGS: Lungs are clear. Heart size and pulmonary vascularity are normal. No adenopathy. No pneumothorax. No bone lesions. There is postoperative change in the lower cervical spine region. IMPRESSION: No edema or consolidation. Electronically Signed   By: Bretta BangWilliam  Woodruff III M.D.   On: 06/06/2016 07:14   Ct Angio Abd/pel W And/or Wo Contrast Result Date: 06/06/2016 CLINICAL DATA:  Abdominal  pain radiating into back EXAM: CTA ABDOMEN AND PELVIS   WITH CONTRAST TECHNIQUE: Multidetector CT imaging of the abdomen and pelvis was performed using the standard protocol during bolus administration of intravenous contrast. Multiplanar reconstructed images and MIPs were obtained and reviewed to evaluate the vascular anatomy. CONTRAST:  100 mL Isovue 370 nonionic COMPARISON:  None. FINDINGS: VASCULAR Aorta: There is no demonstrable abdominal aortic aneurysm or dissection. There is no appreciable arterial vascular narrowing. No appreciable calcification is noted in the aorta. Minimal atherosclerotic plaque noted at the bifurcation. Celiac: No aneurysm or dissection. Celiac artery and its major branches appear widely patent. SMA: No aneurysm or dissection. Superior mesenteric artery and its major branches appear widely patent. Renals: There are single renal arteries arising from the aorta bilaterally. A small accessory renal artery on the right is seen arising from the proximal right renal  artery supplying a small segment of the upper pole of the right kidney. Renal arteries are widely patent bilaterally. No aneurysm or dissection. No demonstrable fibromuscular dysplasia. IMA: Inferior mesenteric artery and its branches appear widely patent. No aneurysm or dissection. Inflow: There is slight calcification in both proximal common iliac arteries. There is no major pelvic vessel narrowing or thrombus appreciable. No aneurysm or dissection in the major pelvic arterial vessels. Proximal Outflow: Visualized femoral arteries and their respective major branches appear widely patent bilaterally. No aneurysm or dissection. Veins: No obvious venous abnormality within the limitations of this arterial phase study. Review of the MIP images confirms the above findings. NON-VASCULAR Lower chest: There is atelectatic change in the left lung base. No lung base airspace consolidation. Hepatobiliary: No focal liver lesions are evident. Gallbladder is filled with calculi. Gallbladder wall does not appear appreciably thickened. There is no biliary duct dilatation. Pancreas: No pancreatic mass or inflammatory focus. Spleen: No splenic lesions are evident. Adrenals/Urinary Tract: Adrenals appear normal bilaterally. There is a 1 x 1 cm cyst arising the lateral upper to mid left kidney. There is no hydronephrosis on either side. No renal or ureteral calculus on either side. Urinary bladder is midline with wall thickness within normal limits. Stomach/Bowel: There is no appreciable bowel wall or mesenteric thickening. No bowel obstruction. No free air or portal venous air. Lymphatic: There is no adenopathy in the abdomen or pelvis. Reproductive: Prostate and seminal vesicles appear normal in size and contour. There are a few small prostatic calculi inferiorly. Other: Appendix appears normal. There is no ascites or abscess in the abdomen or pelvis. Musculoskeletal: There are foci of degenerative change in the lower thoracic and  lumbar spine regions. No blastic or lytic bone lesions. No intramuscular or abdominal wall lesions evident. IMPRESSION: VASCULAR No aneurysm or dissection in the aorta or visualized arterial vessels. There is minimal atherosclerotic change in the bifurcation and proximal most aspects of each common iliac artery. No hemodynamically significant obstruction noted in the visualized arterial vessels. NON-VASCULAR Cholelithiasis. This finding may warrant ultrasound of the gallbladder for further assessment. By CT, gallbladder wall does not appear appreciably thickened. No bowel obstruction. No abscess. Appendix appears normal. No renal or ureteral calculus. No hydronephrosis. A few small prostatic calculi are noted. Electronically Signed   By: Bretta Bang III M.D.   On: 06/06/2016 07:23    US Abdomen Limited Ruq Result Date: 06/06/2016 CLINICAL DATA:  Gallbladder calculus. EXAM: US ABDOMEN LIMITED - RIGHT UPPER QUADRANT COMPARISON:  Abdominal CT from earlier today FINDINGS: Gallbladder: Numerous calculi within the gallbladder with dense posterior shadowing. There is no wall  thickening or focal tenderness. Common bile duct: Diameter: 3 mm Liver: Echogenic from steatosis.  Negative for mass. IMPRESSION: 1. Cholelithiasis without signs of cholecystitis. 2. Hepatic steatosis. Electronically Signed   By: Marnee Spring M.D.   On: 06/06/2016 09:11      Samuel Jester, DO 06/06/16 1140

## 2016-06-06 NOTE — Discharge Instructions (Signed)
Eat a bland diet, avoiding greasy, fatty, fried foods, as well as spicy and acidic foods or beverages.  Avoid eating within the hour or 2 before going to bed or laying down.  Also avoid teas, colas, coffee, chocolate, pepermint and spearment. Take the prescriptions as directed.  Call the General Surgeon today to schedule a follow up appointment within the next week.  Return to the Emergency Department immediately if worsening.

## 2016-06-06 NOTE — Consult Note (Signed)
SURGICAL CONSULTATION NOTE (initial) - cpt: [phone]  HISTORY OF PRESENT ILLNESS (HPI):  51 y.o. male presented with RUQ abdominal pain and nausea that began around midnight last night after he ate [employer] for dinner. The pain overnight was transiently "numbed" with Dilaudid, but quickly recurred as bad as or worse than it started. This morning, after a single dose of fentanyl an hour and a half ago, he says his pain and nausea have both nearly resolved, stating he "would not have come in to the ER if it was like this". He now requests something to drink and to go outside to smoke. Patient denies any prior episodes of RUQ abdominal pain severe enough to prompt evaluation or treatment for it, but he does acknowledge multiple prior episodes of Right-sided abdominal pain after eating that he'd attributed to IBS-associated bloating. He otherwise denies reflux/heartburn, fever/chills, CP, or SOB unless he walks "far" and reports +flatus and BM WNL.  Surgery is consulted by ED physician Dr. Clarene Duke in this context for evaluation and management of cholelithiasis.  PAST MEDICAL HISTORY (PMH):  Past Medical History:  Diagnosis Date  . Arthritis   . Carpal tunnel syndrome, bilateral   . Cervical radiculopathy   . Chronic neck and back pain   . Chronic pain syndrome   . Chronic sinusitis   . Multilevel degenerative disc disease      PAST SURGICAL HISTORY (PSH):  Past Surgical History:  Procedure Laterality Date  . HAND SURGERY    . NASAL SINUS SURGERY    . NECK SURGERY       MEDICATIONS:  Prior to Admission medications   Medication Sig Start Date End Date Taking? Authorizing Provider  ALPRAZolam Prudy Feeler) 1 MG tablet Take 1 mg by mouth at bedtime as needed for anxiety.    Yes Historical Provider, MD  citalopram (CELEXA) 40 MG tablet Take 40 mg by mouth at bedtime. 10/25/15  Yes Historical Provider, MD  loratadine (CLARITIN) 10 MG tablet Take 10 mg by mouth daily.   Yes Historical Provider,  MD  oxyCODONE-acetaminophen (PERCOCET) 7.5-325 MG tablet Take 1 tablet by mouth every 12 (twelve) hours as needed for pain. 01/07/16  Yes Historical Provider, MD  predniSONE (STERAPRED UNI-PAK 21 TAB) 10 MG (21) TBPK tablet Take 10 mg by mouth as directed. Tapered dose 05/25/16  Yes Historical Provider, MD  HYDROcodone-acetaminophen (NORCO/VICODIN) 5-325 MG tablet 1 or 2 tabs PO q6 hours prn pain 06/06/16   Samuel Jester, DO  meclizine (ANTIVERT) 25 MG tablet Take 1 tablet (25 mg total) by mouth 3 (three) times daily as needed for dizziness. 02/05/16   Samuel Jester, DO  ondansetron (ZOFRAN) 4 MG tablet Take 1 tablet (4 mg total) by mouth every 8 (eight) hours as needed for nausea or vomiting. 06/06/16   Samuel Jester, DO     ALLERGIES:  Allergies  Allergen Reactions  . Asa [Aspirin] Swelling  . Ibuprofen Swelling  . Tomato Nausea And Vomiting     SOCIAL HISTORY:  Social History   Social History  . Marital status: Divorced    Spouse name: N/A  . Number of children: N/A  . Years of education: N/A   Occupational History  . Not on file.   Social History Main Topics  . Smoking status: Current Every Day Smoker    Packs/day: 2.00    Years: 26.00    Types: Cigarettes  . Smokeless tobacco: Never Used  . Alcohol use Yes     Comment: twice a year  .  Drug use: No  . Sexual activity: Not Currently   Other Topics Concern  . Not on file   Social History Narrative  . No narrative on file    The patient currently resides (home / rehab facility / nursing home): Home  The patient normally is (ambulatory / bedbound): Ambulatory   FAMILY HISTORY:  Family History  Problem Relation Age of Onset  . Cancer Mother   . Heart attack Father     REVIEW OF SYSTEMS:  Constitutional: denies weight loss, fever, chills, or sweats  Eyes: denies any other vision changes, history of eye injury  ENT: denies sore throat, hearing problems  Respiratory: denies shortness of breath, wheezing   Cardiovascular: denies chest pain, palpitations  Gastrointestinal: abdominal pain, N/V, and bowel function as per HPI Genitourinary: denies burning with urination or urinary frequency Musculoskeletal: denies any other joint pains or cramps  Skin: denies any other rashes or skin discolorations  Neurological: denies any other headache, dizziness, weakness  Psychiatric: denies any other depression, anxiety   All other review of systems were negative   VITAL SIGNS:  Temp:  [97.6 F (36.4 C)] 97.6 F (36.4 C) (01/17 1136) Pulse Rate:  [62-69] 64 (01/17 1136) Resp:  [14-18] 18 (01/17 1136) BP: (120-159)/(70-95) 128/84 (01/17 1136) SpO2:  [92 %-100 %] 99 % (01/17 1136) Weight:  [108 kg (238 lb)] 108 kg (238 lb) (01/17 0442)     Height: 6' (182.9 cm) Weight: 108 kg (238 lb) BMI (Calculated): 32.3   INTAKE/OUTPUT:  This shift: No intake/output data recorded.  Last 2 shifts: @IOLAST2SHIFTS @   PHYSICAL EXAM:  Constitutional:  -- Normal overweight body habitus  -- Awake, alert, and oriented x3  Eyes:  -- Pupils equally round and reactive to light  -- No scleral icterus  Ear, nose, and throat:  -- No jugular venous distension  Pulmonary:  -- No crackles  -- Equal breath sounds bilaterally -- Breathing non-labored at rest Cardiovascular:  -- S1, S2 present  -- No pericardial rubs Gastrointestinal:  -- Abdomen soft, minimal RUQ tenderness to even deep palpation, nondistended, no guarding/rebound  -- No abdominal masses appreciated, pulsatile or otherwise  Musculoskeletal and Integumentary:  -- Wounds or skin discoloration: None appreciated -- Extremities: B/L UE and LE FROM, hands and feet warm, no edema  Neurologic:  -- Motor function: intact and symmetric -- Sensation: intact and symmetric  Labs:  CBC:  Lab Results  Component Value Date   WBC 17.6 (H) 06/06/2016   RBC 5.84 (H) 06/06/2016   CMP Latest Ref Rng & Units 06/06/2016 02/05/2016  Glucose 65 - 99 mg/dL 409(W135(H) 97   BUN 6 - 20 mg/dL 15 12  Creatinine 1.190.61 - 1.24 mg/dL 1.471.08 8.290.88  Sodium 562135 - 145 mmol/L 137 137  Potassium 3.5 - 5.1 mmol/L 4.3 4.3  Chloride 101 - 111 mmol/L 102 103  CO2 22 - 32 mmol/L 27 27  Calcium 8.9 - 10.3 mg/dL 9.7 9.6  Total Protein 6.5 - 8.1 g/dL 7.7 -  Total Bilirubin 0.3 - 1.2 mg/dL 0.5 -  Alkaline Phos 38 - 126 U/L 61 -  AST 15 - 41 U/L 19 -  ALT 17 - 63 U/L 31 -    Imaging studies:  Limited RUQ Abdominal Ultrasound (06/06/2016) Numerous calculi within the gallbladder with dense posterior shadowing. There is no wall thickening or focal tenderness. Common bile duct diameter measures 3 mm (normal).  Assessment/Plan: (ICD-10's: 66K80.20) 51 y.o. male with symptomatic (painful) cholelithiasis without cholecystitis or  biliary obstruction, complicated by pertinent comorbidities including IBS (irritable bowel syndrome) per patient, narcotic-dependent chronic back and neck pain, chronic sinusitis, and tobacco abuse.   - advised patient to avoid fatty foods (meats, cheeses/dairy, and fried foods)  - similarly, advised patient that grains, fruits, and vegetables are unlikely to reproduce symptoms  - follow-up outpatient to further discuss and schedule elective cholecystectomy for cholelithiasis   - smoking cessation discussed and encouraged, though patient expresses no interest   All of the above findings and recommendations were discussed with the patient and ED physician, and all of patient's his questions were answered to his expressed satisfaction.  Thank you for the opportunity to participate in this patient's care.   -- Scherrie Gerlach Earlene Plater, MD, RPVI [place]: [facility] Office: [phone]

## 2016-06-06 NOTE — ED Triage Notes (Signed)
RUQ pain that started tonight that radiates through to the back.  Slight nausea but denies any vomiting or diarrhea

## 2016-06-06 NOTE — ED Provider Notes (Signed)
[facility]-EMER[facility]Y DEPT Provider Note   CSN: [id number] Arrival date & time: 06/06/16  0437     History   Chief Complaint Chief Complaint  Patient presents with  . Abdominal Pain    HPI [name] is a 51 y.o. male.  HPI  This 51 year old male with a history of chronic pain and degenerative disc disease who presents with abdominal pain. Patient reports onset of diffuse abdominal pain a properly 6 hours ago. Pain radiates to his back. He describes as sharp. Current pain is 10 out of 10. He reports nausea. No vomiting or diarrhea. Denies any weakness, numbness, tingling of the lower extremities. No history of similar pain in the past. Denies alcohol use. No worsening pain with eating. He reports normal bowel movements.  Past Medical History:  Diagnosis Date  . Arthritis   . Carpal tunnel syndrome, bilateral   . Cervical radiculopathy   . Chronic neck and back pain   . Chronic pain syndrome   . Chronic sinusitis   . Multilevel degenerative disc disease     Patient Active Problem List   Diagnosis Date Noted  . Spasm of paraspinal muscle 06/26/2012  . Stiffness of joint, not elsewhere classified, other specified site 06/26/2012    Past Surgical History:  Procedure Laterality Date  . HAND SURGERY    . NASAL SINUS SURGERY    . NECK SURGERY         Home Medications    Prior to Admission medications   Medication Sig Start Date End Date Taking? Authorizing Provider  ALPRAZolam Prudy Feeler(XANAX) 1 MG tablet Take 1 mg by mouth at bedtime as needed for anxiety.     Historical Provider, MD  citalopram (CELEXA) 40 MG tablet Take 40 mg by mouth at bedtime. 10/25/15   Historical Provider, MD  loratadine (CLARITIN) 10 MG tablet Take 10 mg by mouth daily.    Historical Provider, MD  meclizine (ANTIVERT) 25 MG tablet Take 1 tablet (25 mg total) by mouth 3 (three) times daily as needed for dizziness. 02/05/16   Samuel JesterKathleen McManus, DO  oxyCODONE-acetaminophen (PERCOCET) 7.5-325 MG tablet Take 1  tablet by mouth every 12 (twelve) hours as needed for pain. 01/07/16   Historical Provider, MD    Family History Family History  Problem Relation Age of Onset  . Cancer Mother   . Heart attack Father     Social History Social History  Substance Use Topics  . Smoking status: Current Every Day Smoker    Packs/day: 2.00    Years: 26.00    Types: Cigarettes  . Smokeless tobacco: Never Used  . Alcohol use Yes     Comment: twice a year     Allergies   Asa [aspirin]; Ibuprofen; and Tomato   Review of Systems Review of Systems  Constitutional: Negative for fever.  Respiratory: Negative for shortness of breath.   Cardiovascular: Negative for chest pain.  Gastrointestinal: Positive for abdominal pain and nausea. Negative for diarrhea and vomiting.  Neurological: Negative for weakness and numbness.  All other systems reviewed and are negative.    Physical Exam Updated Vital Signs BP 125/87   Pulse 65   Temp 97.6 F (36.4 C) (Oral)   Resp 14   Ht 6' (1.829 m)   Wt 238 lb (108 kg)   SpO2 100%   BMI 32.28 kg/m   Physical Exam  Constitutional: He is oriented to person, place, and time. He appears well-developed and well-nourished. No distress.  HENT:  Head: Normocephalic  and atraumatic.  Cardiovascular: Normal rate, regular rhythm and normal heart sounds.   No murmur heard. Pulmonary/Chest: Effort normal and breath sounds normal. No respiratory distress. He has no wheezes.  Abdominal: Soft. Bowel sounds are normal. He exhibits distension. There is tenderness. There is no rebound and no guarding.  Mild distention, diffuse tenderness to palpation without rebound or guarding, no pulsatile mass  Lymphadenopathy:    He has no cervical adenopathy.  Neurological: He is alert and oriented to person, place, and time.  Skin: Skin is warm and dry.  Psychiatric: He has a normal mood and affect.  Nursing note and vitals reviewed.    ED Treatments / Results  Labs (all labs  ordered are listed, but only abnormal results are displayed) Labs Reviewed  CBC WITH DIFFERENTIAL/PLATELET - Abnormal; Notable for the following:       Result Value   WBC 17.6 (*)    RBC 5.84 (*)    Hemoglobin 17.6 (*)    Neutro Abs 9.7 (*)    Lymphs Abs 6.3 (*)    Monocytes Absolute 1.2 (*)    Basophils Absolute 0.2 (*)    All other components within normal limits  COMPREHENSIVE METABOLIC PANEL - Abnormal; Notable for the following:    Glucose, Bld 135 (*)    All other components within normal limits  LIPASE, BLOOD - Abnormal; Notable for the following:    Lipase 58 (*)    All other components within normal limits  TROPONIN I    EKG  EKG Interpretation  Date/Time:  Wednesday June 06 2016 04:45:14 EST Ventricular Rate:  64 PR Interval:    QRS Duration: 108 QT Interval:  431 QTC Calculation: 445 R Axis:   114 Text Interpretation:  Sinus rhythm Right axis deviation Baseline wander in lead(s) I III aVL Confirmed by Justus Droke  MD, Toni Amend ([id number]) on 06/06/2016 5:57:31 AM       Radiology No results found.  Procedures Procedures (including critical care time)  Medications Ordered in ED Medications  ondansetron (ZOFRAN-ODT) disintegrating tablet 4 mg (4 mg Oral Given 06/06/16 0548)  HYDROmorphone (DILAUDID) injection 1 mg (1 mg Intravenous Given 06/06/16 0608)  ondansetron (ZOFRAN) injection 4 mg (4 mg Intravenous Given 06/06/16 1914)  sodium chloride 0.9 % bolus 1,000 mL (1,000 mLs Intravenous New Bag/Given 06/06/16 0607)  iopamidol (ISOVUE-370) 76 % injection 100 mL (100 mLs Intravenous Contrast Given 06/06/16 0646)     Initial Impression / Assessment and Plan / ED Course  I have reviewed the triage vital signs and the nursing notes.  Pertinent labs & imaging results that were available during my care of the patient were reviewed by me and considered in my medical decision making (see chart for details).  Clinical Course     Patient presents with abdominal pain. Only  reports associated nausea. He has tenderness on exam but no signs of peritonitis. Otherwise exam is reassuring. Basic labwork obtained. Patient given pain and nausea medication. Differential is broad including pancreatitis, cholecystitis, AAA, appendicitis, SBO, gastritis. Given the AAA is on the differential and exam is nonspecific, CT angiogram of the abdomen obtained.  CT with gallstones.  Recommend [place].  Patient having ongoing pain.  Signed out pending [place].  Final Clinical Impressions(s) / ED Diagnoses   Final diagnoses:  None    New Prescriptions New Prescriptions   No medications on file     Shon Baton, MD 06/11/16 [phone]

## 2017-05-02 IMAGING — DX DG CHEST 2V
2 series · 2 of 2 positions shown · non-contrast
Comparison: None.

CLINICAL DATA: Chest and epigastric pain

EXAM:
CHEST  2 VIEW

[chest pa]
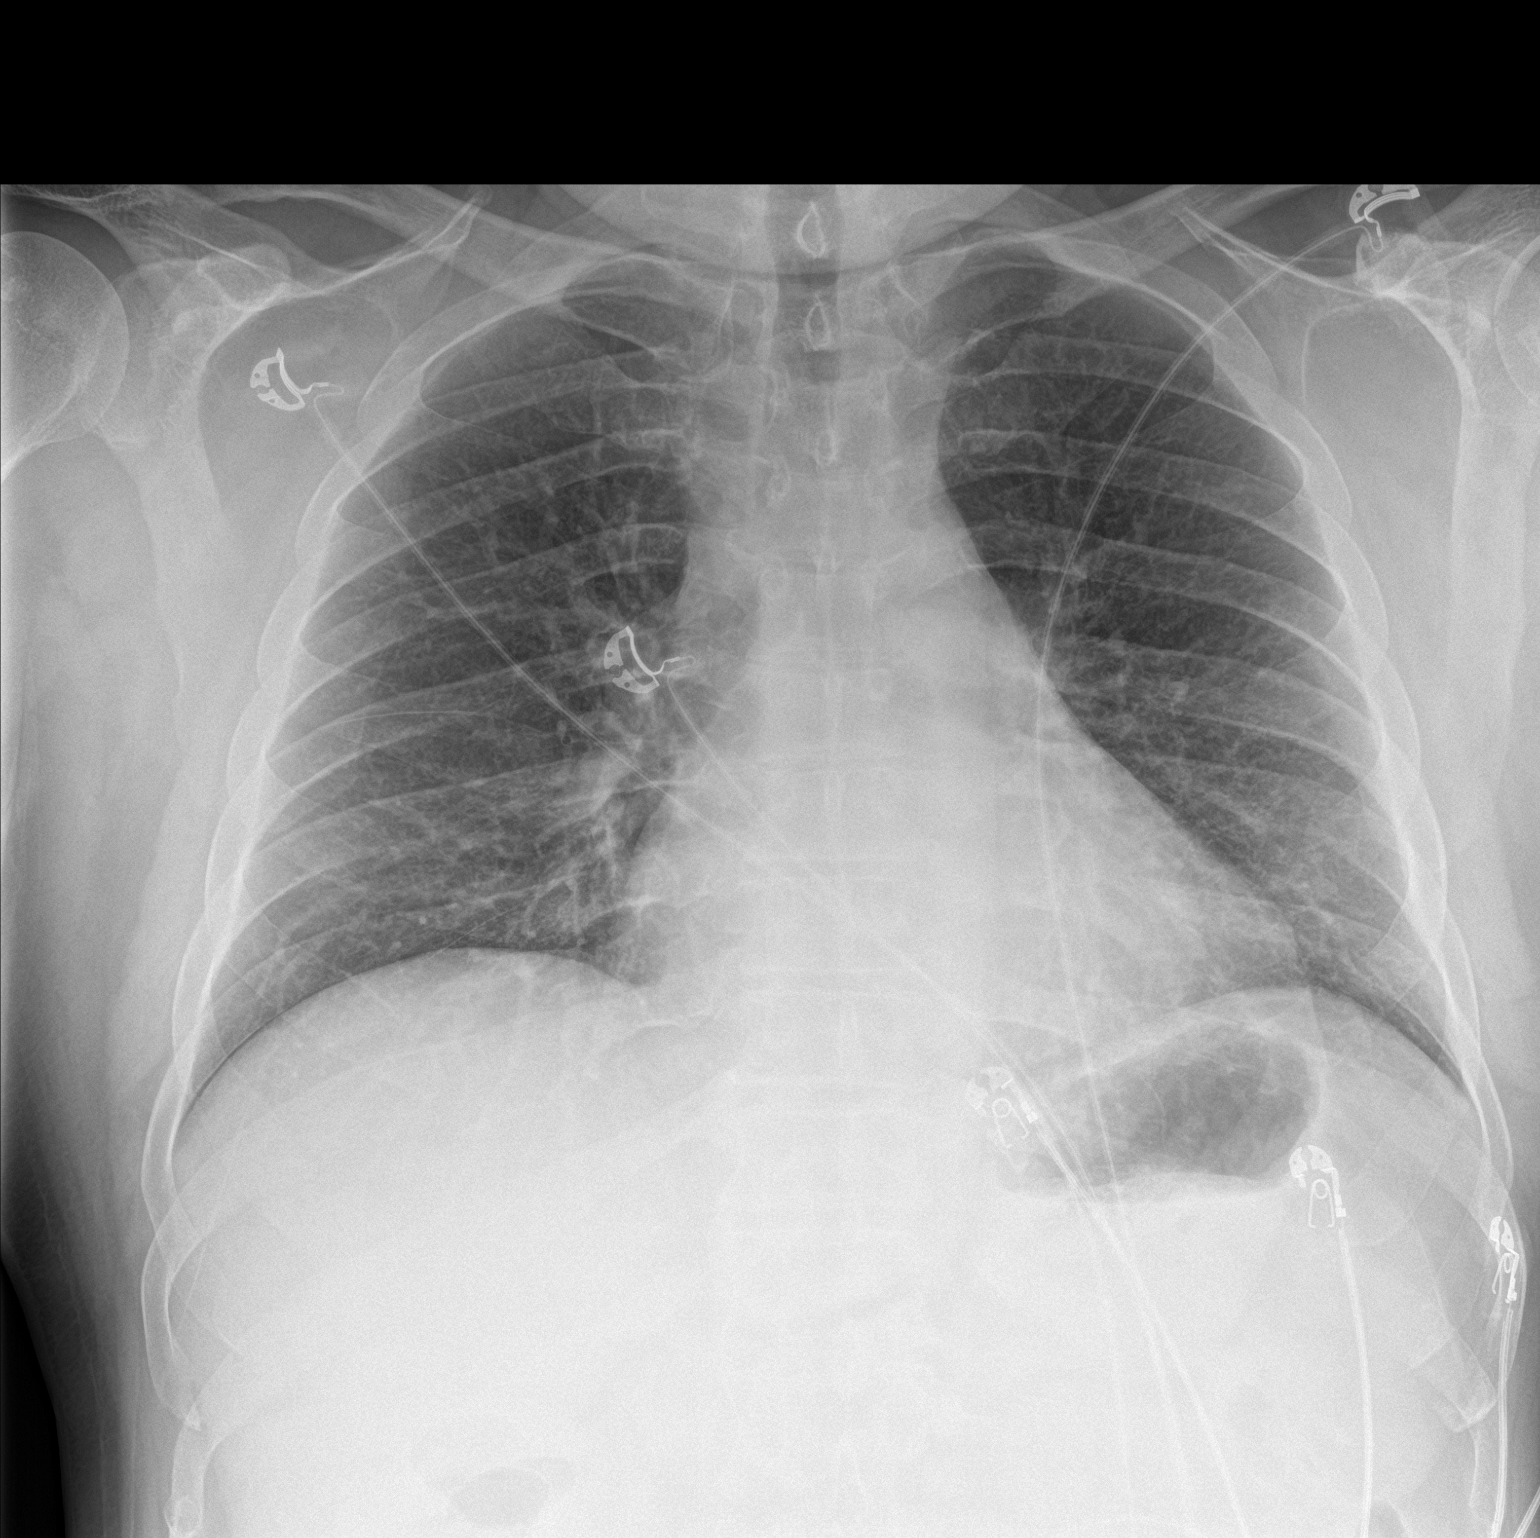

[chest lat]
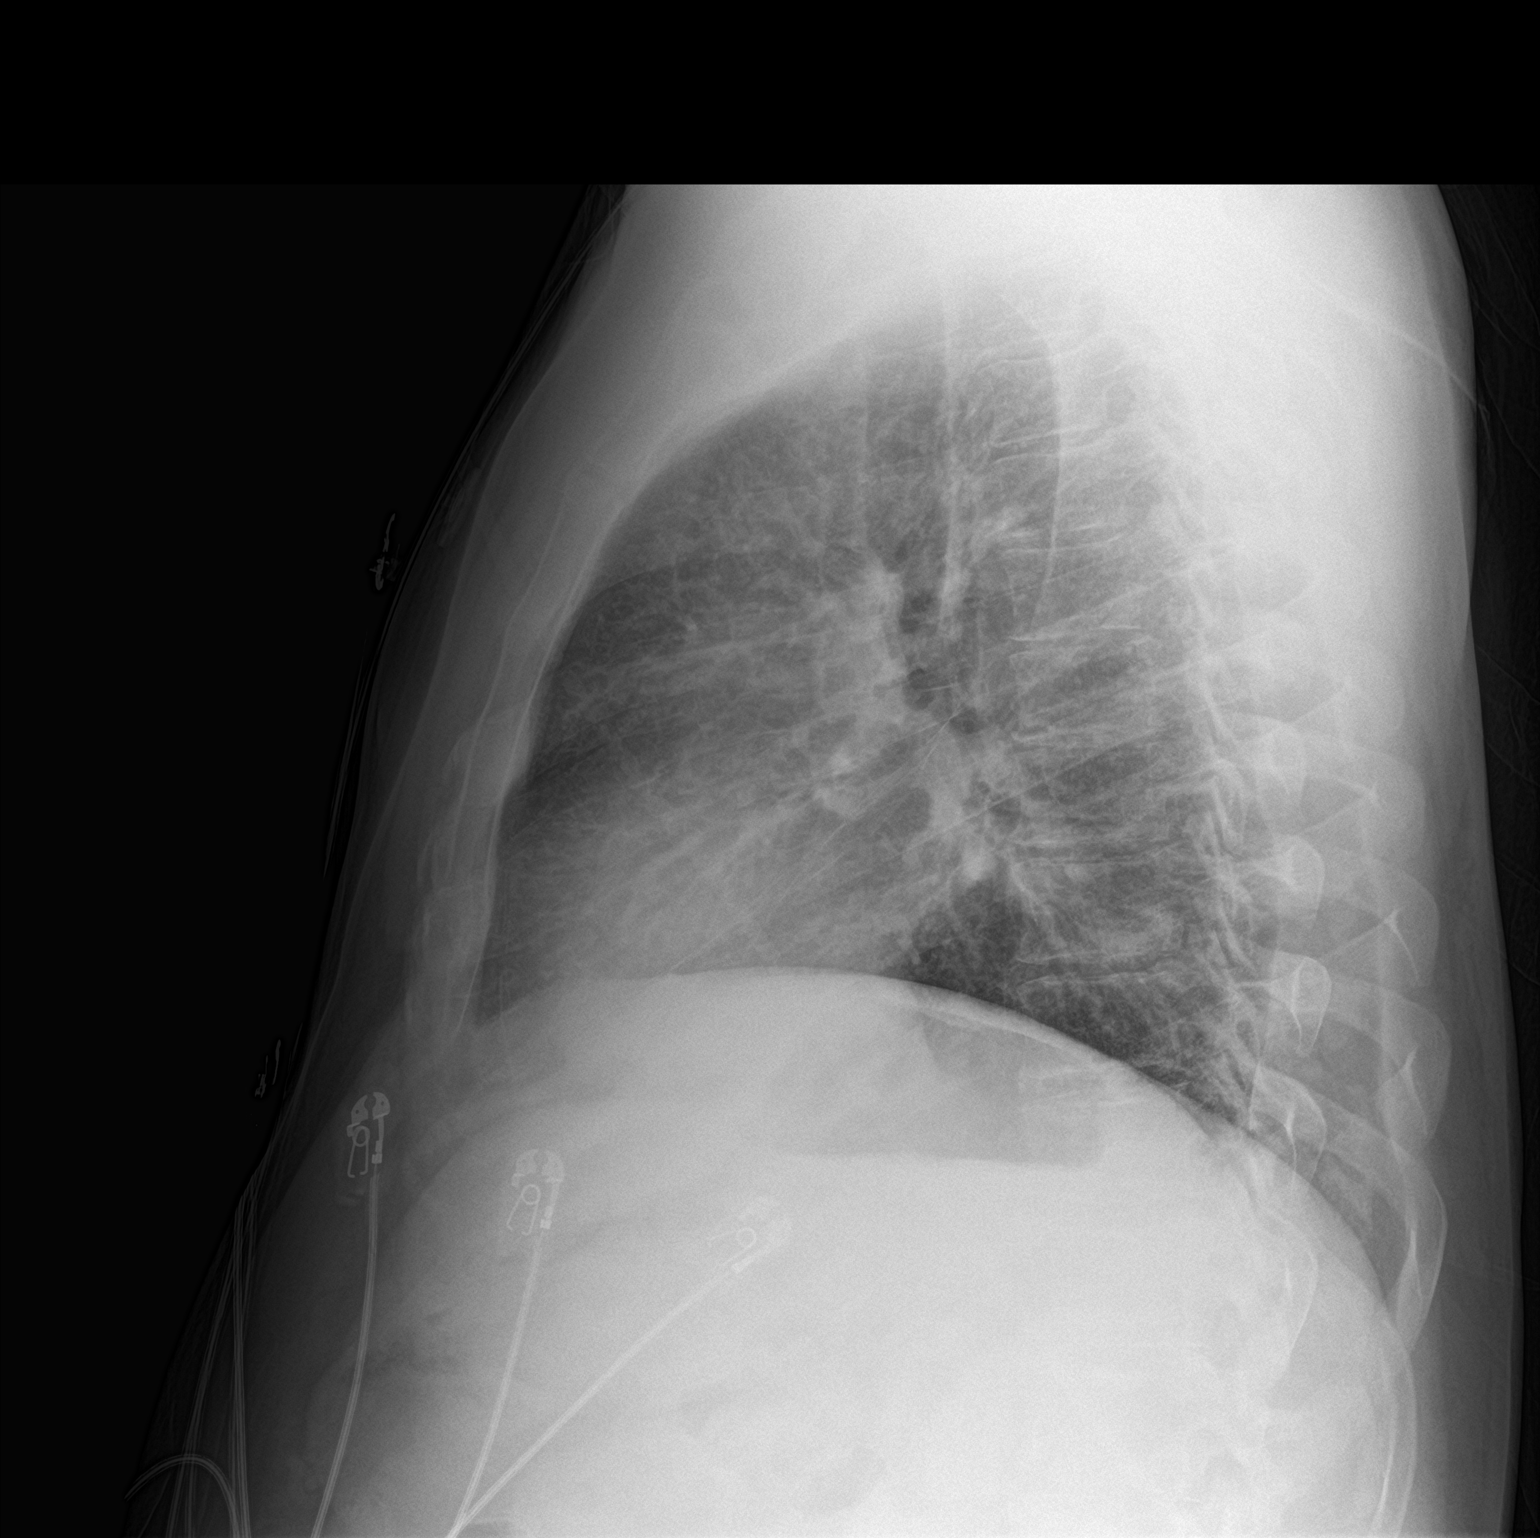

[2 of 2 positions shown; findings below may reference images not displayed]

FINDINGS: Lungs are clear. Heart size and pulmonary vascularity are normal. No
adenopathy. No pneumothorax. No bone lesions. There is postoperative
change in the lower cervical spine region.
IMPRESSION: No edema or consolidation.

## 2017-07-20 ENCOUNTER — Emergency Department (HOSPITAL_COMMUNITY)
Admission: EM | Admit: 2017-07-20 | Discharge: 2017-07-20 | Disposition: A | Discharge status: Home / Self Care | Payer: Self-pay

## 2017-07-20 NOTE — ED Triage Notes (Signed)
Pt did not respond to triage call

## 2017-07-20 NOTE — ED Notes (Signed)
Pt not in wr when called

## 2020-05-26 ENCOUNTER — Telehealth: Payer: Self-pay

## 2020-05-26 NOTE — Telephone Encounter (Signed)
Error

## 2020-06-28 NOTE — Progress Notes (Signed)
 [facility]  Established Patient Note  Primary Care Provider: Katheryn Arnett Billing, [place] Date of Service: 06/28/2020  Reason for Visit: The patient is being seen today by Almarie Maclachlan NP for Office Visit.  Last Clinical Office Visit:9/14 with Me   History of Present Illness: Nathan Farmer is a 55 y.o. old male. Since the last visit the pain is worse. The most significant area of pain is  neck, upper, mid and low back. The pain radiates to arms. The pain improves and is better with pain medication.  The pain is made worse with activity. The patient score typically is 10/10.  Physical therapy completed for this pain compliant:No Physical Therapy performed outside of [facility]:No Location of Physical Therapy:N/A   Did the patient have an injection/procedure since the last visit? No             - Percentage of pain relief from injection:N/A  Has the patient had any new imaging (X-ray, MRI, CT) since the last visit? No  ROS: Neurological:  headache, burning, numbness and tingling Musculoskeletal:  joint pain or swelling, muscle aches and muscle pains   Historical Information:   Past Medical History:  Diagnosis Date  . Anxiety   . Arthritis   . Back pain   . Cervical myofascial pain syndrome   . Cervical radiculopathy   . Chronic pain syndrome   . CTS (carpal tunnel syndrome)   . Failed neck syndrome   . Fibromyalgia   . Gastrointestinal disorder   . Headache   . IBS (irritable bowel syndrome)   . Post laminectomy syndrome     Past Surgical History:  Procedure Laterality Date  . CARPAL TUNNEL RELEASE    . CHOLECYSTECTOMY  2018  . HAND SURGERY    . NECK SURGERY  2000   C5-6 fusion  . SINUS SURGERY      Family History  Problem Relation Age of Onset  . Cancer Mother        uterine  . Heart attack Father     Social History   Socioeconomic History  . Marital status: Single    Spouse name: Not on file  .  Number of children: Not on file  . Years of education: Not on file  . Highest education level: Not on file  Occupational History  . Not on file  Tobacco Use  . Smoking status: Current Every Day Smoker    Types: Cigarettes  . Smokeless tobacco: Never Used  Substance and Sexual Activity  . Alcohol use: No  . Drug use: No  . Sexual activity: Never    Birth control/protection: None  Other Topics Concern  . Not on file  Social History Narrative  . Not on file   Social Determinants of Health   Financial Resource Strain: Not on file  Food Insecurity: Not on file  Transportation Needs: Not on file  Physical Activity: Not on file  Stress: Not on file  Social Connections: Not on file  Housing Stability: Not on file    Allergies:   is allergic to aspirin, ibuprofen, and tomato.  Current Medications:  has a current medication list which includes the following prescription(s): alprazolam, citalopram, cyclobenzaprine , doxycycline hyclate, loratadine, meclizine , naloxone, oxycodone -acetaminophen , oxycodone -acetaminophen , oxycodone -acetaminophen , [START ON 07/03/2020] oxycodone -acetaminophen , and prednisone .  Review of Systems: A complete ROS was performed with positive/negative pertinent findings listed in HPI.   Physical Exam: Vitals:   06/28/20 0921  BP: 136/85  Pulse:  89  Temp: 98 F (36.7 C)  SpO2: 97%  PainSc: 8-Eight (severe)  Position: Sitting    General: Constitutional: Well developed, Well nourished, No acute distress and Interactive General: Patient is alert and orientated Psychological: The patient's mood is normal, and appropriate for the circumstances  _____________________________________________________________________________________ CTE Measures for Return Visits: Is the patient taking prescription opioids? Yes    Is this patient 18 years or older and has been prescribed opioid medications for longer than 5 weeks of duration? Yes/No: Yes    Has chemical  dependency screening (includes laboratory testing and/or questionnaire) been performed within the immediate 6 months prior to the encounter or on this encounter? Yes/No: Yes    Is the patient taking > 50 Oral Morphine Equivalents per day?  Yes/No: No    Is the patient on opioids and co-prescribed benzodiazepines?  No   Monitoring/Surveillance: [place]  Controlled Substance Reporting System: Reviewed Opioid Agreement: Yes - (date): 04/2020 Urine Drug Screen: 01/2020 Amphetamines  Date Value Ref Range Status  06/03/2018 Negative Negative Final    Comment:    Amphetamines Cut-Off = 1000 ng/mL   Barbiturates  Date Value Ref Range Status  06/03/2018 Negative Negative Final    Comment:    Barbiturates Cut-Off = 300 ng/mL   Benzodiazepines  Date Value Ref Range Status  06/03/2018 Positive (A) Negative Final    Comment:    Benzodiazepines Cut-Off = 200 ng/mL   Cocaine  Date Value Ref Range Status  06/03/2018 Negative Negative Final    Comment:    Cocaine Cut-Off = 300 ng/mL   Opiates  Date Value Ref Range Status  06/03/2018 Negative Negative Final    Comment:    Opiates Cut-Off = 300 ng/mL   THC  Date Value Ref Range Status  06/03/2018 Negative Negative Final    Comment:    THC (Cannabinoids) Cut-Off = 50 ng/mL     No components found for: IUDS ______________________________________________________________________________________ Imaging and Diagnostic Studies:  All applicable diagnostic studies related to this consultation have been reviewed. Relevant diagnostic reports and/or my personal review of imaging or other diagnostic studies listed below:  none ______________________________________________________________________________________  Past Medical History Reviewed: Yes ______________________________________________________________________________________  Relevant Labs Reviewed:   Lab Results  Component Value Date   CREATININE 1.04 11/27/2016     Lab Results  Component Value Date   HGB 17.8 (H) 11/27/2016   HCT 51.6 11/27/2016   MCV 89.7 11/27/2016   PLT 295 11/27/2016    No results found for: HBA1C, HGBA1C, PA1C   A/P: Diagnosis/Impression: Mr. Nathan Farmer is a 55 y.o. male presents to clinic for routine follow-up for medication refill.  Mr. Bauer recently obtained insurance but unfortunately [facility] does not accept his insurance.  His insurance however will cover his medication.  Discussed transitioning to buprenorphine.  Previously tried and failed Butrans in the past.  We will move forward with Belbuca at this time.  I have previously given him education material to review.  He voiced understanding and did not have any questions today.   Postlaminectomy syndrome of cervical region -     buprenorphine HCL (BELBUCA) 300 mcg film  Cervical myofascial pain syndrome  Medication management  Encounter for opiate analgesic use agreement  Spondylosis of lumbar region without myelopathy or radiculopathy -     buprenorphine HCL (BELBUCA) 300 mcg film    Recommended Plan of Care: - Cervical/ThoracicTPI in office, he has nuchal ligamentcalcificationat C5-6. Pt is self pay and cash price must be provided  prior to scheduling(TPI will cost patient over $600).  -FailedButrans- rash -Continueoxycodone 7.5/325 TID.#90prescription dated for 2/13 at pharmacy.  He will continue oxycodone  for the next 3 weeks and begin to wean off - Patient is taking Xanax.He isaware of risks of opiate pain medication along with benzodiazepines. 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 Consider Lumbar XR and MBBs to [facility] if coverage is attained in the future.  - Continue Flexeril  as prescribed.  -Continue heat application and  chiropractic care- He states does help -Start Belbuca 300 mcg twice daily. NRF.  Advised that it may take up to 2 to 3-weeks .  May continue to oxycodone  for 3 weeks  Follow-up in 4 weeks  Treatment plan fully discussed and agreed upon with patient. All questions were answered.  Reviewed with the patient the goals of chronic opioid therapy. All patients on chronic opioid therapy will be followed with frequent clinic visits and periodic drug screens. Patient must have functional improvement to continue on this class of medications. In addition, they must be low risk for addiction and opioid misuse disorder. Our goal as a clinic is overall to minimize opioids with the use of interventional and adjuvant therapies. Additionally, we will try to use opioids with lower risk profiles when at all possible.  Time was spent on reviewing patient PMH, medications, procedures and image. Preforming medically appropriate examination, counseling and educating patient and family.  Documentation on day of service.  Electronically signed by: Almarie Ellouise Maclachlan, NP 06/28/2020 9:14 AM      Electronically signed by: Almarie Ellouise Maclachlan, NP 06/28/20 [phone]

## 2021-01-20 ENCOUNTER — Ambulatory Visit (HOSPITAL_COMMUNITY)
Admission: RE | Admit: 2021-01-20 | Discharge: 2021-01-20 | Disposition: A | Discharge status: Home / Self Care | Payer: 59 | Source: Ambulatory Visit | Attending: Family Medicine | Admitting: Family Medicine

## 2021-01-20 ENCOUNTER — Other Ambulatory Visit: Payer: Self-pay

## 2021-01-20 ENCOUNTER — Other Ambulatory Visit (HOSPITAL_COMMUNITY): Payer: Self-pay | Admitting: Family Medicine

## 2021-01-20 DIAGNOSIS — M545 Low back pain, unspecified: Secondary | ICD-10-CM | POA: Diagnosis not present

## 2021-12-16 IMAGING — DX DG LUMBAR SPINE 2-3V
3 series · 3 of 3 positions shown · non-contrast
Comparison: Radiograph 12/01/2012, CT 06/06/2016

CLINICAL DATA: Lower back pain

EXAM:
LUMBAR SPINE - 2-3 VIEW

[l-spine ap]
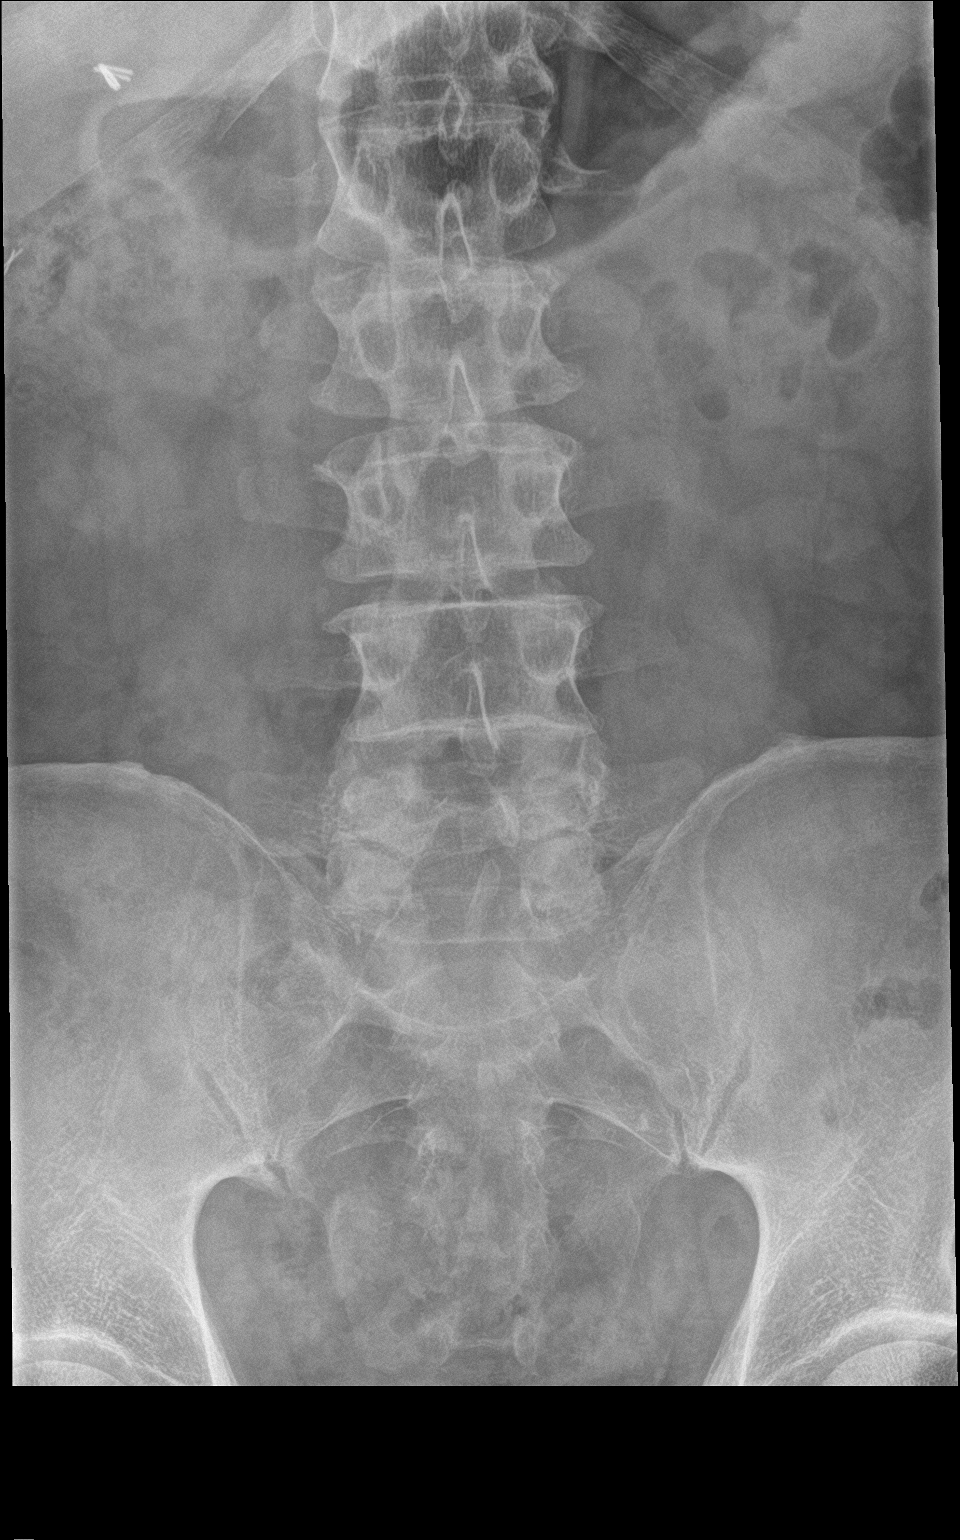

[l-spine lat]
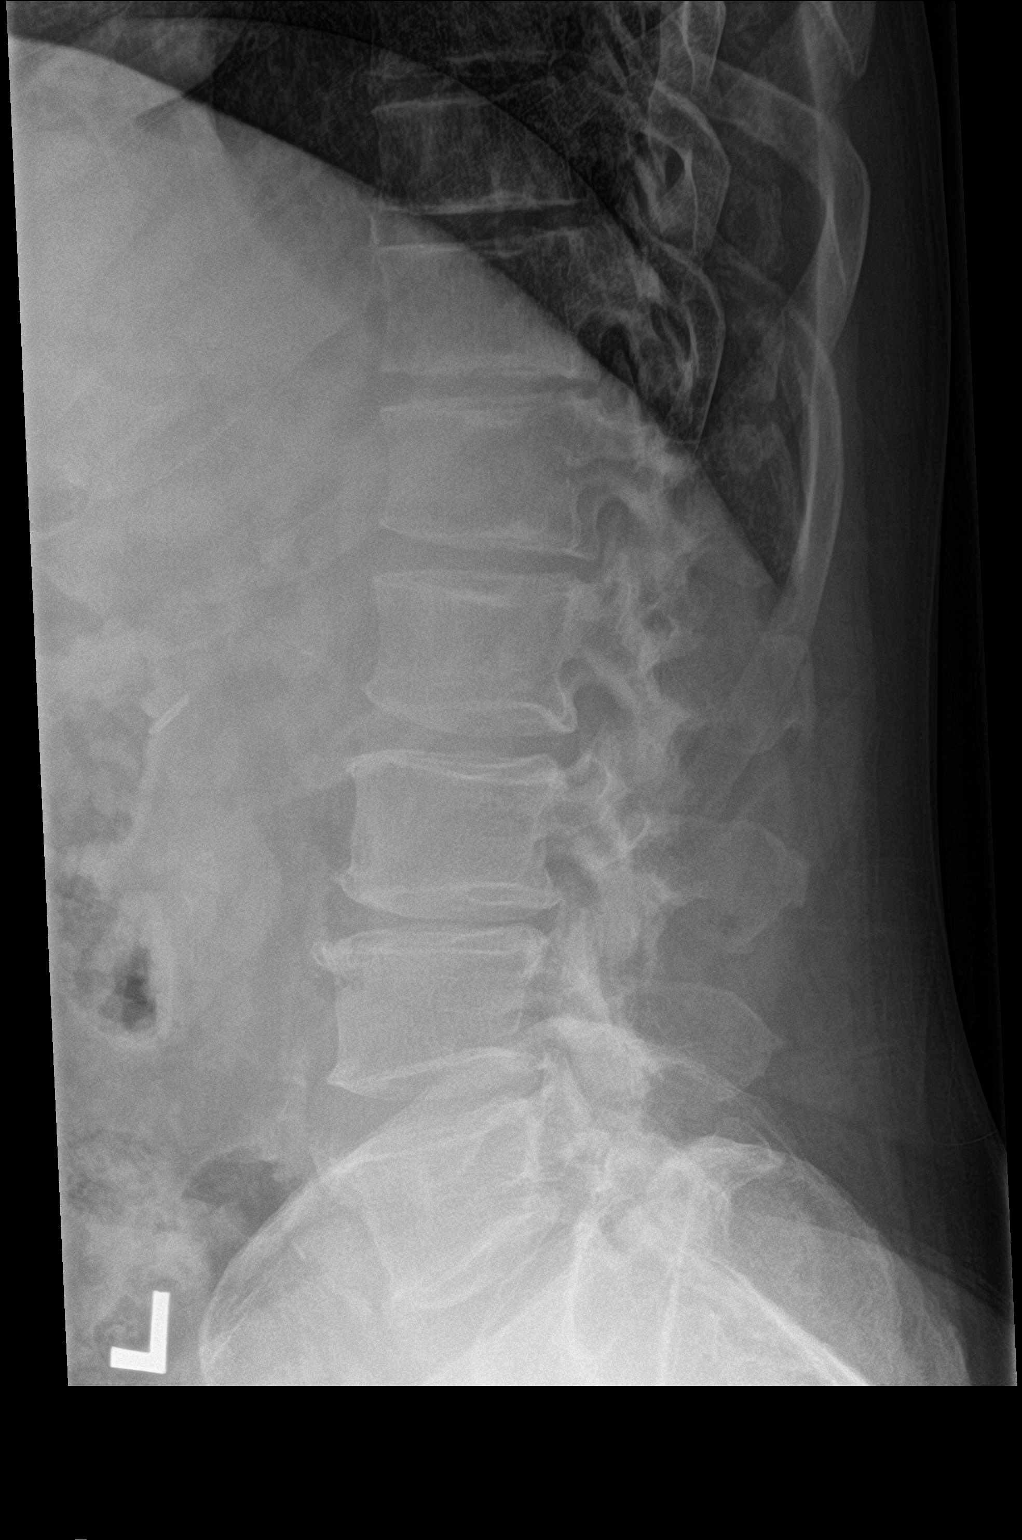

[l-spine spot]
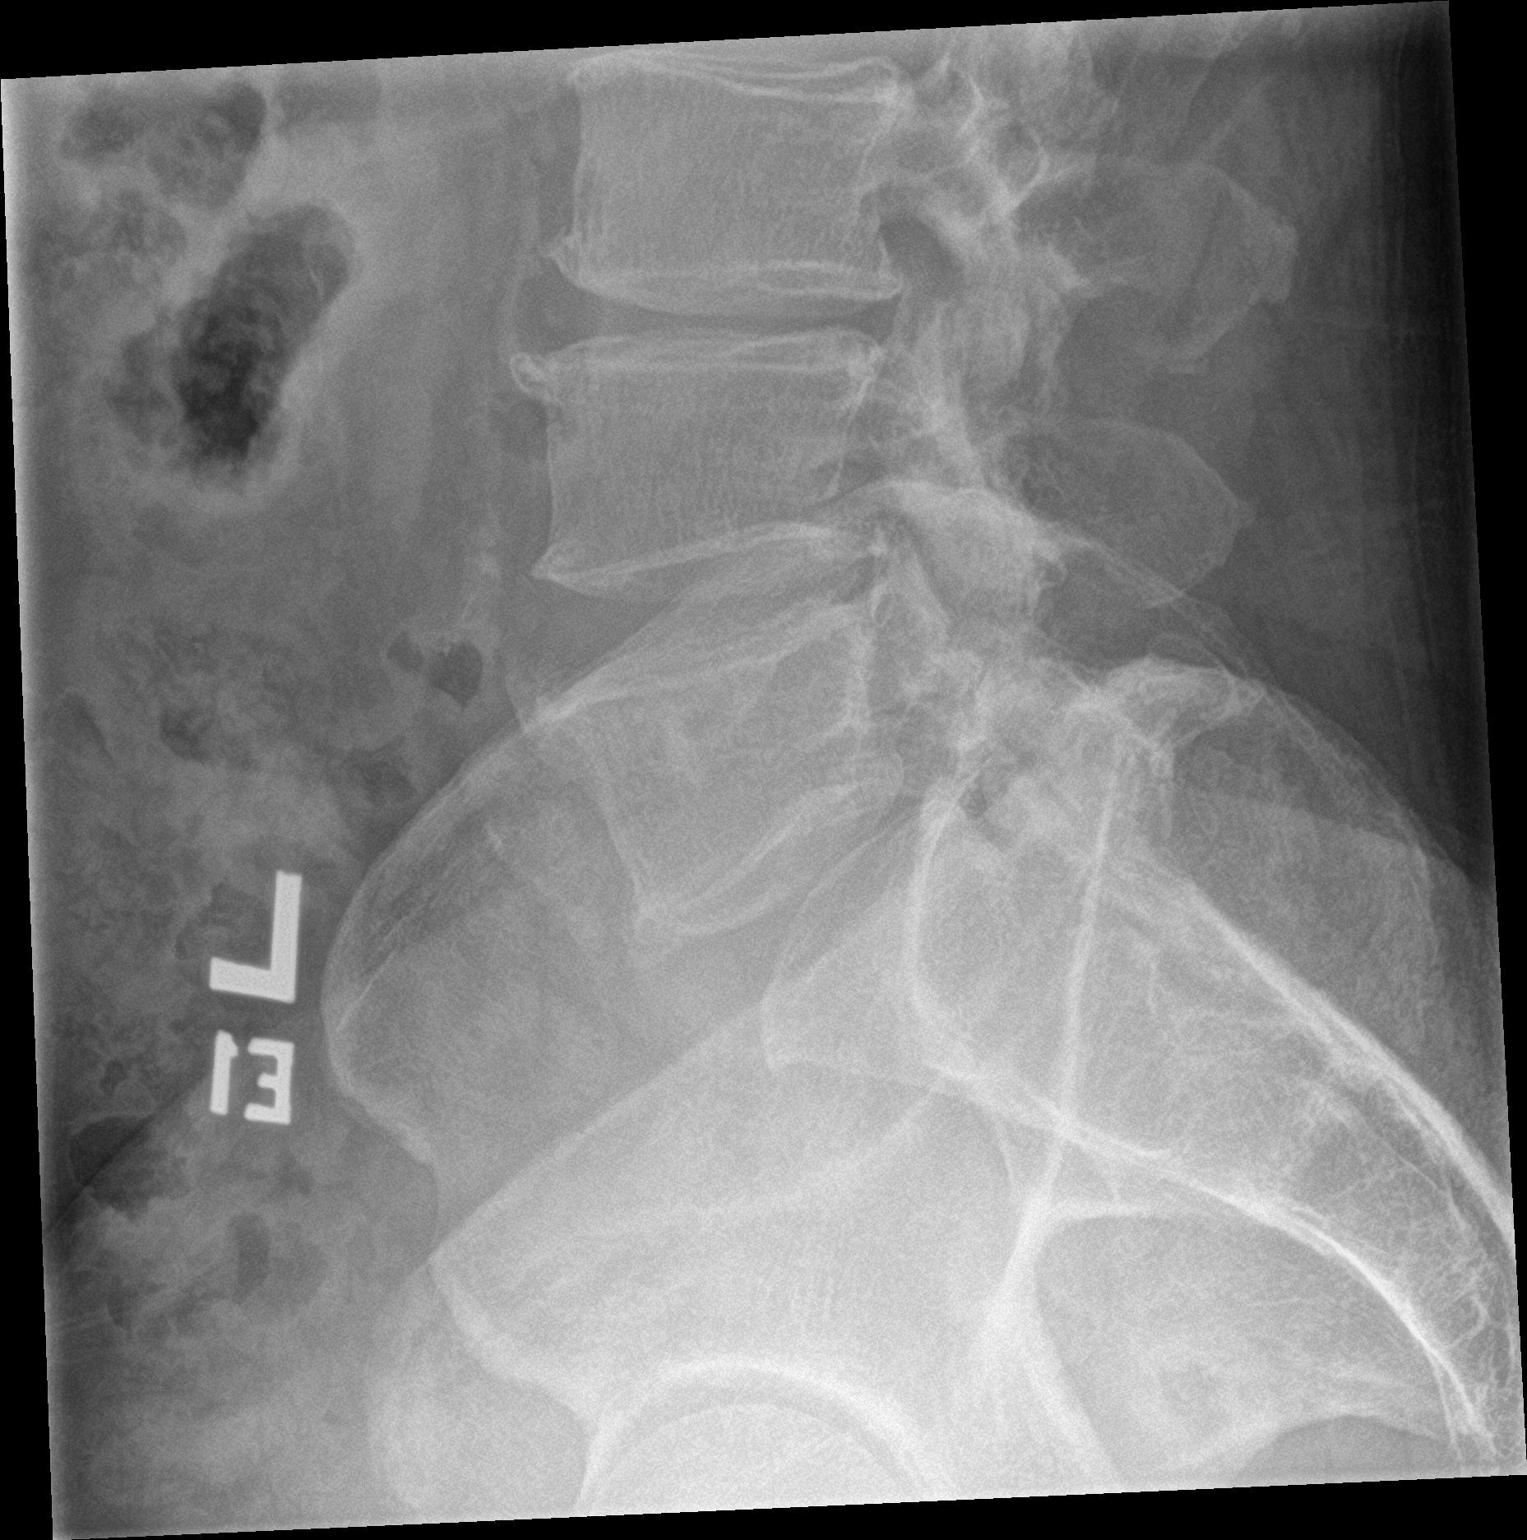

[3 of 3 positions shown; findings below may reference images not displayed]

FINDINGS: There are 5 non-rib-bearing lumbar vertebrae. There is
mild-to-moderate degenerative disc disease, most prominent at L3-L4
with trace retrolisthesis at this level. Trace retrolisthesis at
L2-L3 and L4-L5. Trace anterolisthesis at L5-S1. Moderate lower
lumbar predominant facet arthropathy, with chronic bilateral L5 pars
interarticularis defects.
IMPRESSION: Mild-to-moderate multilevel degenerative disc disease, most
prominent at L3-L4 with trace retrolisthesis at this level.

Moderate lower lumbar predominant facet arthropathy.

Trace retrolisthesis at L2-L3 and L4-L5. Trace anterolisthesis at
L5-S1.

Chronic bilateral L5 pars interarticularis defects.

## 2022-01-29 DIAGNOSIS — M9905 Segmental and somatic dysfunction of pelvic region: Secondary | ICD-10-CM | POA: Diagnosis not present

## 2022-01-29 DIAGNOSIS — M546 Pain in thoracic spine: Secondary | ICD-10-CM | POA: Diagnosis not present

## 2022-01-29 DIAGNOSIS — J019 Acute sinusitis, unspecified: Secondary | ICD-10-CM | POA: Diagnosis not present

## 2022-01-29 DIAGNOSIS — M9903 Segmental and somatic dysfunction of lumbar region: Secondary | ICD-10-CM | POA: Diagnosis not present

## 2022-01-29 DIAGNOSIS — E669 Obesity, unspecified: Secondary | ICD-10-CM | POA: Diagnosis not present

## 2022-01-29 DIAGNOSIS — G4733 Obstructive sleep apnea (adult) (pediatric): Secondary | ICD-10-CM | POA: Diagnosis not present

## 2022-01-29 DIAGNOSIS — M62830 Muscle spasm of back: Secondary | ICD-10-CM | POA: Diagnosis not present

## 2022-01-29 DIAGNOSIS — M9902 Segmental and somatic dysfunction of thoracic region: Secondary | ICD-10-CM | POA: Diagnosis not present

## 2022-01-29 DIAGNOSIS — Z6832 Body mass index (BMI) 32.0-32.9, adult: Secondary | ICD-10-CM | POA: Diagnosis not present

## 2022-01-29 DIAGNOSIS — R69 Illness, unspecified: Secondary | ICD-10-CM | POA: Diagnosis not present

## 2022-01-29 DIAGNOSIS — G8929 Other chronic pain: Secondary | ICD-10-CM | POA: Diagnosis not present

## 2022-01-29 DIAGNOSIS — E782 Mixed hyperlipidemia: Secondary | ICD-10-CM | POA: Diagnosis not present

## 2022-02-05 DIAGNOSIS — M9902 Segmental and somatic dysfunction of thoracic region: Secondary | ICD-10-CM | POA: Diagnosis not present

## 2022-02-05 DIAGNOSIS — M9903 Segmental and somatic dysfunction of lumbar region: Secondary | ICD-10-CM | POA: Diagnosis not present

## 2022-02-05 DIAGNOSIS — M546 Pain in thoracic spine: Secondary | ICD-10-CM | POA: Diagnosis not present

## 2022-02-05 DIAGNOSIS — M62830 Muscle spasm of back: Secondary | ICD-10-CM | POA: Diagnosis not present

## 2022-02-05 DIAGNOSIS — M9905 Segmental and somatic dysfunction of pelvic region: Secondary | ICD-10-CM | POA: Diagnosis not present

## 2022-02-07 DIAGNOSIS — M9905 Segmental and somatic dysfunction of pelvic region: Secondary | ICD-10-CM | POA: Diagnosis not present

## 2022-02-07 DIAGNOSIS — M62830 Muscle spasm of back: Secondary | ICD-10-CM | POA: Diagnosis not present

## 2022-02-07 DIAGNOSIS — M9903 Segmental and somatic dysfunction of lumbar region: Secondary | ICD-10-CM | POA: Diagnosis not present

## 2022-02-07 DIAGNOSIS — M546 Pain in thoracic spine: Secondary | ICD-10-CM | POA: Diagnosis not present

## 2022-02-07 DIAGNOSIS — M9902 Segmental and somatic dysfunction of thoracic region: Secondary | ICD-10-CM | POA: Diagnosis not present

## 2022-02-09 DIAGNOSIS — M9905 Segmental and somatic dysfunction of pelvic region: Secondary | ICD-10-CM | POA: Diagnosis not present

## 2022-02-09 DIAGNOSIS — M9903 Segmental and somatic dysfunction of lumbar region: Secondary | ICD-10-CM | POA: Diagnosis not present

## 2022-02-09 DIAGNOSIS — M62830 Muscle spasm of back: Secondary | ICD-10-CM | POA: Diagnosis not present

## 2022-02-09 DIAGNOSIS — M546 Pain in thoracic spine: Secondary | ICD-10-CM | POA: Diagnosis not present

## 2022-02-09 DIAGNOSIS — M9902 Segmental and somatic dysfunction of thoracic region: Secondary | ICD-10-CM | POA: Diagnosis not present

## 2022-02-12 DIAGNOSIS — M9903 Segmental and somatic dysfunction of lumbar region: Secondary | ICD-10-CM | POA: Diagnosis not present

## 2022-02-12 DIAGNOSIS — M9905 Segmental and somatic dysfunction of pelvic region: Secondary | ICD-10-CM | POA: Diagnosis not present

## 2022-02-12 DIAGNOSIS — M9902 Segmental and somatic dysfunction of thoracic region: Secondary | ICD-10-CM | POA: Diagnosis not present

## 2022-02-12 DIAGNOSIS — M546 Pain in thoracic spine: Secondary | ICD-10-CM | POA: Diagnosis not present

## 2022-02-12 DIAGNOSIS — M62830 Muscle spasm of back: Secondary | ICD-10-CM | POA: Diagnosis not present

## 2022-02-14 DIAGNOSIS — M9903 Segmental and somatic dysfunction of lumbar region: Secondary | ICD-10-CM | POA: Diagnosis not present

## 2022-02-14 DIAGNOSIS — M546 Pain in thoracic spine: Secondary | ICD-10-CM | POA: Diagnosis not present

## 2022-02-14 DIAGNOSIS — M62830 Muscle spasm of back: Secondary | ICD-10-CM | POA: Diagnosis not present

## 2022-02-14 DIAGNOSIS — M9902 Segmental and somatic dysfunction of thoracic region: Secondary | ICD-10-CM | POA: Diagnosis not present

## 2022-02-14 DIAGNOSIS — M9905 Segmental and somatic dysfunction of pelvic region: Secondary | ICD-10-CM | POA: Diagnosis not present

## 2022-02-19 DIAGNOSIS — M9902 Segmental and somatic dysfunction of thoracic region: Secondary | ICD-10-CM | POA: Diagnosis not present

## 2022-02-19 DIAGNOSIS — M546 Pain in thoracic spine: Secondary | ICD-10-CM | POA: Diagnosis not present

## 2022-02-19 DIAGNOSIS — M62830 Muscle spasm of back: Secondary | ICD-10-CM | POA: Diagnosis not present

## 2022-02-19 DIAGNOSIS — M9905 Segmental and somatic dysfunction of pelvic region: Secondary | ICD-10-CM | POA: Diagnosis not present

## 2022-02-19 DIAGNOSIS — R69 Illness, unspecified: Secondary | ICD-10-CM | POA: Diagnosis not present

## 2022-02-19 DIAGNOSIS — J019 Acute sinusitis, unspecified: Secondary | ICD-10-CM | POA: Diagnosis not present

## 2022-02-19 DIAGNOSIS — M9903 Segmental and somatic dysfunction of lumbar region: Secondary | ICD-10-CM | POA: Diagnosis not present

## 2022-04-30 DIAGNOSIS — M9903 Segmental and somatic dysfunction of lumbar region: Secondary | ICD-10-CM | POA: Diagnosis not present

## 2022-04-30 DIAGNOSIS — M62830 Muscle spasm of back: Secondary | ICD-10-CM | POA: Diagnosis not present

## 2022-04-30 DIAGNOSIS — M9905 Segmental and somatic dysfunction of pelvic region: Secondary | ICD-10-CM | POA: Diagnosis not present

## 2022-04-30 DIAGNOSIS — E782 Mixed hyperlipidemia: Secondary | ICD-10-CM | POA: Diagnosis not present

## 2022-04-30 DIAGNOSIS — M9901 Segmental and somatic dysfunction of cervical region: Secondary | ICD-10-CM | POA: Diagnosis not present

## 2022-04-30 DIAGNOSIS — M9902 Segmental and somatic dysfunction of thoracic region: Secondary | ICD-10-CM | POA: Diagnosis not present

## 2022-04-30 DIAGNOSIS — M546 Pain in thoracic spine: Secondary | ICD-10-CM | POA: Diagnosis not present

## 2022-04-30 DIAGNOSIS — M542 Cervicalgia: Secondary | ICD-10-CM | POA: Diagnosis not present

## 2022-04-30 DIAGNOSIS — R7303 Prediabetes: Secondary | ICD-10-CM | POA: Diagnosis not present

## 2022-06-27 DIAGNOSIS — J019 Acute sinusitis, unspecified: Secondary | ICD-10-CM | POA: Diagnosis not present

## 2022-06-27 DIAGNOSIS — M542 Cervicalgia: Secondary | ICD-10-CM | POA: Diagnosis not present

## 2022-06-27 DIAGNOSIS — M546 Pain in thoracic spine: Secondary | ICD-10-CM | POA: Diagnosis not present

## 2022-06-27 DIAGNOSIS — M9903 Segmental and somatic dysfunction of lumbar region: Secondary | ICD-10-CM | POA: Diagnosis not present

## 2022-06-27 DIAGNOSIS — M9901 Segmental and somatic dysfunction of cervical region: Secondary | ICD-10-CM | POA: Diagnosis not present

## 2022-06-27 DIAGNOSIS — M62830 Muscle spasm of back: Secondary | ICD-10-CM | POA: Diagnosis not present

## 2022-06-27 DIAGNOSIS — M9902 Segmental and somatic dysfunction of thoracic region: Secondary | ICD-10-CM | POA: Diagnosis not present

## 2022-06-27 DIAGNOSIS — M9905 Segmental and somatic dysfunction of pelvic region: Secondary | ICD-10-CM | POA: Diagnosis not present

## 2022-09-07 DIAGNOSIS — M62830 Muscle spasm of back: Secondary | ICD-10-CM | POA: Diagnosis not present

## 2022-09-07 DIAGNOSIS — M9902 Segmental and somatic dysfunction of thoracic region: Secondary | ICD-10-CM | POA: Diagnosis not present

## 2022-09-07 DIAGNOSIS — M9901 Segmental and somatic dysfunction of cervical region: Secondary | ICD-10-CM | POA: Diagnosis not present

## 2022-09-07 DIAGNOSIS — M546 Pain in thoracic spine: Secondary | ICD-10-CM | POA: Diagnosis not present

## 2022-09-07 DIAGNOSIS — M9903 Segmental and somatic dysfunction of lumbar region: Secondary | ICD-10-CM | POA: Diagnosis not present

## 2022-09-26 DIAGNOSIS — M9903 Segmental and somatic dysfunction of lumbar region: Secondary | ICD-10-CM | POA: Diagnosis not present

## 2022-09-26 DIAGNOSIS — M62830 Muscle spasm of back: Secondary | ICD-10-CM | POA: Diagnosis not present

## 2022-09-26 DIAGNOSIS — M546 Pain in thoracic spine: Secondary | ICD-10-CM | POA: Diagnosis not present

## 2022-09-26 DIAGNOSIS — M9901 Segmental and somatic dysfunction of cervical region: Secondary | ICD-10-CM | POA: Diagnosis not present

## 2022-09-26 DIAGNOSIS — M9902 Segmental and somatic dysfunction of thoracic region: Secondary | ICD-10-CM | POA: Diagnosis not present

## 2022-09-27 DIAGNOSIS — G8929 Other chronic pain: Secondary | ICD-10-CM | POA: Diagnosis not present

## 2022-09-27 DIAGNOSIS — M549 Dorsalgia, unspecified: Secondary | ICD-10-CM | POA: Diagnosis not present

## 2022-09-27 DIAGNOSIS — M5451 Vertebrogenic low back pain: Secondary | ICD-10-CM | POA: Diagnosis not present

## 2022-11-09 DIAGNOSIS — E782 Mixed hyperlipidemia: Secondary | ICD-10-CM | POA: Diagnosis not present

## 2022-11-09 DIAGNOSIS — R7303 Prediabetes: Secondary | ICD-10-CM | POA: Diagnosis not present

## 2022-11-09 DIAGNOSIS — Z139 Encounter for screening, unspecified: Secondary | ICD-10-CM | POA: Diagnosis not present

## 2022-11-13 ENCOUNTER — Ambulatory Visit (HOSPITAL_COMMUNITY): Payer: Self-pay | Admitting: Physical Therapy

## 2022-11-15 DIAGNOSIS — J0100 Acute maxillary sinusitis, unspecified: Secondary | ICD-10-CM | POA: Diagnosis not present

## 2022-11-15 DIAGNOSIS — F17210 Nicotine dependence, cigarettes, uncomplicated: Secondary | ICD-10-CM | POA: Diagnosis not present

## 2022-11-15 DIAGNOSIS — G8929 Other chronic pain: Secondary | ICD-10-CM | POA: Diagnosis not present

## 2022-11-15 DIAGNOSIS — R7989 Other specified abnormal findings of blood chemistry: Secondary | ICD-10-CM | POA: Diagnosis not present

## 2022-11-15 DIAGNOSIS — Z0000 Encounter for general adult medical examination without abnormal findings: Secondary | ICD-10-CM | POA: Diagnosis not present

## 2022-11-15 DIAGNOSIS — M549 Dorsalgia, unspecified: Secondary | ICD-10-CM | POA: Diagnosis not present

## 2022-11-15 DIAGNOSIS — R7303 Prediabetes: Secondary | ICD-10-CM | POA: Diagnosis not present

## 2022-11-15 DIAGNOSIS — F419 Anxiety disorder, unspecified: Secondary | ICD-10-CM | POA: Diagnosis not present

## 2022-11-15 DIAGNOSIS — M5451 Vertebrogenic low back pain: Secondary | ICD-10-CM | POA: Diagnosis not present

## 2022-11-15 DIAGNOSIS — E782 Mixed hyperlipidemia: Secondary | ICD-10-CM | POA: Diagnosis not present

## 2022-12-14 DIAGNOSIS — M9901 Segmental and somatic dysfunction of cervical region: Secondary | ICD-10-CM | POA: Diagnosis not present

## 2022-12-14 DIAGNOSIS — M62830 Muscle spasm of back: Secondary | ICD-10-CM | POA: Diagnosis not present

## 2022-12-14 DIAGNOSIS — M9902 Segmental and somatic dysfunction of thoracic region: Secondary | ICD-10-CM | POA: Diagnosis not present

## 2022-12-14 DIAGNOSIS — M9903 Segmental and somatic dysfunction of lumbar region: Secondary | ICD-10-CM | POA: Diagnosis not present

## 2022-12-14 DIAGNOSIS — M546 Pain in thoracic spine: Secondary | ICD-10-CM | POA: Diagnosis not present

## 2023-01-09 DIAGNOSIS — M9903 Segmental and somatic dysfunction of lumbar region: Secondary | ICD-10-CM | POA: Diagnosis not present

## 2023-01-09 DIAGNOSIS — M546 Pain in thoracic spine: Secondary | ICD-10-CM | POA: Diagnosis not present

## 2023-01-09 DIAGNOSIS — M9902 Segmental and somatic dysfunction of thoracic region: Secondary | ICD-10-CM | POA: Diagnosis not present

## 2023-01-09 DIAGNOSIS — M62830 Muscle spasm of back: Secondary | ICD-10-CM | POA: Diagnosis not present

## 2023-01-09 DIAGNOSIS — M9901 Segmental and somatic dysfunction of cervical region: Secondary | ICD-10-CM | POA: Diagnosis not present

## 2023-02-04 DIAGNOSIS — M9901 Segmental and somatic dysfunction of cervical region: Secondary | ICD-10-CM | POA: Diagnosis not present

## 2023-02-04 DIAGNOSIS — M9902 Segmental and somatic dysfunction of thoracic region: Secondary | ICD-10-CM | POA: Diagnosis not present

## 2023-02-04 DIAGNOSIS — M9903 Segmental and somatic dysfunction of lumbar region: Secondary | ICD-10-CM | POA: Diagnosis not present

## 2023-02-04 DIAGNOSIS — M546 Pain in thoracic spine: Secondary | ICD-10-CM | POA: Diagnosis not present

## 2023-02-04 DIAGNOSIS — M62830 Muscle spasm of back: Secondary | ICD-10-CM | POA: Diagnosis not present

## 2023-02-06 DIAGNOSIS — M9902 Segmental and somatic dysfunction of thoracic region: Secondary | ICD-10-CM | POA: Diagnosis not present

## 2023-02-06 DIAGNOSIS — M9903 Segmental and somatic dysfunction of lumbar region: Secondary | ICD-10-CM | POA: Diagnosis not present

## 2023-02-06 DIAGNOSIS — M9901 Segmental and somatic dysfunction of cervical region: Secondary | ICD-10-CM | POA: Diagnosis not present

## 2023-02-06 DIAGNOSIS — M546 Pain in thoracic spine: Secondary | ICD-10-CM | POA: Diagnosis not present

## 2023-02-06 DIAGNOSIS — M62830 Muscle spasm of back: Secondary | ICD-10-CM | POA: Diagnosis not present

## 2023-03-13 DIAGNOSIS — J302 Other seasonal allergic rhinitis: Secondary | ICD-10-CM | POA: Diagnosis not present

## 2023-03-13 DIAGNOSIS — J0100 Acute maxillary sinusitis, unspecified: Secondary | ICD-10-CM | POA: Diagnosis not present

## 2023-03-22 DIAGNOSIS — M546 Pain in thoracic spine: Secondary | ICD-10-CM | POA: Diagnosis not present

## 2023-03-22 DIAGNOSIS — M9901 Segmental and somatic dysfunction of cervical region: Secondary | ICD-10-CM | POA: Diagnosis not present

## 2023-03-22 DIAGNOSIS — M9902 Segmental and somatic dysfunction of thoracic region: Secondary | ICD-10-CM | POA: Diagnosis not present

## 2023-03-22 DIAGNOSIS — M62830 Muscle spasm of back: Secondary | ICD-10-CM | POA: Diagnosis not present

## 2023-03-22 DIAGNOSIS — M9903 Segmental and somatic dysfunction of lumbar region: Secondary | ICD-10-CM | POA: Diagnosis not present

## 2023-05-16 DIAGNOSIS — E782 Mixed hyperlipidemia: Secondary | ICD-10-CM | POA: Diagnosis not present

## 2023-05-16 DIAGNOSIS — R2231 Localized swelling, mass and lump, right upper limb: Secondary | ICD-10-CM | POA: Diagnosis not present

## 2023-05-16 DIAGNOSIS — R7303 Prediabetes: Secondary | ICD-10-CM | POA: Diagnosis not present

## 2023-05-16 DIAGNOSIS — M7989 Other specified soft tissue disorders: Secondary | ICD-10-CM | POA: Diagnosis not present

## 2023-05-16 DIAGNOSIS — M79641 Pain in right hand: Secondary | ICD-10-CM | POA: Diagnosis not present

## 2023-05-17 ENCOUNTER — Ambulatory Visit (HOSPITAL_COMMUNITY)
Admission: RE | Admit: 2023-05-17 | Discharge: 2023-05-17 | Disposition: A | Discharge status: Home / Self Care | Payer: 59 | Source: Ambulatory Visit | Attending: Family Medicine | Admitting: Family Medicine

## 2023-05-17 ENCOUNTER — Other Ambulatory Visit (HOSPITAL_COMMUNITY): Payer: Self-pay | Admitting: Family Medicine

## 2023-05-17 DIAGNOSIS — R52 Pain, unspecified: Secondary | ICD-10-CM | POA: Diagnosis not present

## 2023-05-17 DIAGNOSIS — M25531 Pain in right wrist: Secondary | ICD-10-CM | POA: Diagnosis not present

## 2023-05-21 ENCOUNTER — Other Ambulatory Visit (HOSPITAL_COMMUNITY): Payer: Self-pay | Admitting: Family Medicine

## 2023-05-21 DIAGNOSIS — D72829 Elevated white blood cell count, unspecified: Secondary | ICD-10-CM | POA: Diagnosis not present

## 2023-05-21 DIAGNOSIS — M545 Low back pain, unspecified: Secondary | ICD-10-CM | POA: Diagnosis not present

## 2023-05-21 DIAGNOSIS — M7989 Other specified soft tissue disorders: Secondary | ICD-10-CM | POA: Diagnosis not present

## 2023-05-21 DIAGNOSIS — E669 Obesity, unspecified: Secondary | ICD-10-CM | POA: Diagnosis not present

## 2023-05-21 DIAGNOSIS — M5451 Vertebrogenic low back pain: Secondary | ICD-10-CM | POA: Diagnosis not present

## 2023-05-21 DIAGNOSIS — Z6831 Body mass index (BMI) 31.0-31.9, adult: Secondary | ICD-10-CM | POA: Diagnosis not present

## 2023-05-21 DIAGNOSIS — R7303 Prediabetes: Secondary | ICD-10-CM | POA: Diagnosis not present

## 2023-05-21 DIAGNOSIS — G4730 Sleep apnea, unspecified: Secondary | ICD-10-CM | POA: Diagnosis not present

## 2023-05-21 DIAGNOSIS — F17210 Nicotine dependence, cigarettes, uncomplicated: Secondary | ICD-10-CM | POA: Diagnosis not present

## 2023-05-21 DIAGNOSIS — E782 Mixed hyperlipidemia: Secondary | ICD-10-CM | POA: Diagnosis not present

## 2023-05-21 DIAGNOSIS — F419 Anxiety disorder, unspecified: Secondary | ICD-10-CM | POA: Diagnosis not present

## 2023-05-21 DIAGNOSIS — R718 Other abnormality of red blood cells: Secondary | ICD-10-CM | POA: Diagnosis not present

## 2023-05-21 DIAGNOSIS — F17200 Nicotine dependence, unspecified, uncomplicated: Secondary | ICD-10-CM

## 2023-05-30 ENCOUNTER — Encounter (HOSPITAL_COMMUNITY): Payer: Self-pay | Admitting: Family Medicine

## 2023-06-21 ENCOUNTER — Inpatient Hospital Stay: Admission: RE | Admit: 2023-06-21 | Payer: 59 | Source: Ambulatory Visit

## 2023-08-19 ENCOUNTER — Other Ambulatory Visit: Payer: Self-pay | Admitting: Internal Medicine

## 2023-08-19 ENCOUNTER — Other Ambulatory Visit: Payer: Self-pay | Admitting: Family Medicine

## 2023-08-19 ENCOUNTER — Other Ambulatory Visit: Payer: Self-pay | Admitting: Dermatology

## 2023-08-19 DIAGNOSIS — R2231 Localized swelling, mass and lump, right upper limb: Secondary | ICD-10-CM

## 2023-08-19 DIAGNOSIS — F17200 Nicotine dependence, unspecified, uncomplicated: Secondary | ICD-10-CM

## 2023-09-06 ENCOUNTER — Ambulatory Visit
Admission: RE | Admit: 2023-09-06 | Discharge: 2023-09-06 | Disposition: A | Discharge status: Home / Self Care | Source: Ambulatory Visit | Attending: Internal Medicine | Admitting: Internal Medicine

## 2023-09-06 DIAGNOSIS — F17200 Nicotine dependence, unspecified, uncomplicated: Secondary | ICD-10-CM

## 2023-09-11 ENCOUNTER — Encounter: Payer: Self-pay | Admitting: Internal Medicine

## 2023-09-12 ENCOUNTER — Encounter: Payer: Self-pay | Admitting: Internal Medicine

## 2023-09-18 ENCOUNTER — Ambulatory Visit
Admission: RE | Admit: 2023-09-18 | Discharge: 2023-09-18 | Disposition: A | Discharge status: Home / Self Care | Payer: Self-pay | Source: Ambulatory Visit | Attending: Internal Medicine | Admitting: Internal Medicine

## 2023-09-18 DIAGNOSIS — R2231 Localized swelling, mass and lump, right upper limb: Secondary | ICD-10-CM

## 2023-11-15 DIAGNOSIS — E782 Mixed hyperlipidemia: Secondary | ICD-10-CM | POA: Diagnosis not present

## 2023-11-15 DIAGNOSIS — R7303 Prediabetes: Secondary | ICD-10-CM | POA: Diagnosis not present

## 2023-11-15 DIAGNOSIS — Z125 Encounter for screening for malignant neoplasm of prostate: Secondary | ICD-10-CM | POA: Diagnosis not present

## 2023-11-20 ENCOUNTER — Encounter (INDEPENDENT_AMBULATORY_CARE_PROVIDER_SITE_OTHER): Payer: Self-pay | Admitting: *Deleted

## 2024-05-26 ENCOUNTER — Encounter (INDEPENDENT_AMBULATORY_CARE_PROVIDER_SITE_OTHER): Payer: Self-pay | Admitting: *Deleted
